# Patient Record
Sex: Male | Born: 1935 | Race: White | Hispanic: No | Marital: Married | Smoking: Former smoker
Health system: Southern US, Community
[De-identification: ages and names within clinical notes are randomized; demographics above are authoritative.]

## PROBLEM LIST (undated history)

## (undated) DIAGNOSIS — F0390 Unspecified dementia without behavioral disturbance: Secondary | ICD-10-CM

## (undated) DIAGNOSIS — I10 Essential (primary) hypertension: Secondary | ICD-10-CM

---

## 1998-03-15 ENCOUNTER — Ambulatory Visit (HOSPITAL_COMMUNITY): Admission: RE | Admit: 1998-03-15 | Discharge: 1998-03-15 | Payer: Self-pay | Admitting: Internal Medicine

## 1998-10-03 ENCOUNTER — Encounter: Payer: Self-pay | Admitting: Internal Medicine

## 1998-10-03 ENCOUNTER — Ambulatory Visit (HOSPITAL_COMMUNITY): Admission: RE | Admit: 1998-10-03 | Discharge: 1998-10-03 | Payer: Self-pay | Admitting: Internal Medicine

## 1999-12-16 ENCOUNTER — Encounter: Payer: Self-pay | Admitting: Specialist

## 1999-12-18 ENCOUNTER — Ambulatory Visit (HOSPITAL_COMMUNITY): Admission: RE | Admit: 1999-12-18 | Discharge: 1999-12-18 | Payer: Self-pay | Admitting: Specialist

## 2000-08-06 ENCOUNTER — Encounter: Payer: Self-pay | Admitting: Pediatrics

## 2000-08-06 ENCOUNTER — Encounter: Payer: Self-pay | Admitting: Internal Medicine

## 2000-08-06 ENCOUNTER — Ambulatory Visit (HOSPITAL_COMMUNITY): Admission: RE | Admit: 2000-08-06 | Discharge: 2000-08-06 | Payer: Self-pay | Admitting: Internal Medicine

## 2006-09-16 ENCOUNTER — Ambulatory Visit: Payer: Self-pay | Admitting: Thoracic Surgery

## 2010-07-09 ENCOUNTER — Ambulatory Visit: Admission: RE | Admit: 2010-07-09 | Payer: Self-pay | Source: Home / Self Care | Admitting: Orthopedic Surgery

## 2011-09-16 ENCOUNTER — Ambulatory Visit: Payer: Medicare PPO | Attending: Internal Medicine | Admitting: *Deleted

## 2011-09-16 DIAGNOSIS — R269 Unspecified abnormalities of gait and mobility: Secondary | ICD-10-CM | POA: Insufficient documentation

## 2011-09-16 DIAGNOSIS — I69998 Other sequelae following unspecified cerebrovascular disease: Secondary | ICD-10-CM | POA: Insufficient documentation

## 2011-09-16 DIAGNOSIS — IMO0001 Reserved for inherently not codable concepts without codable children: Secondary | ICD-10-CM | POA: Insufficient documentation

## 2011-09-16 DIAGNOSIS — R279 Unspecified lack of coordination: Secondary | ICD-10-CM | POA: Insufficient documentation

## 2011-09-16 DIAGNOSIS — M6281 Muscle weakness (generalized): Secondary | ICD-10-CM | POA: Insufficient documentation

## 2011-09-23 ENCOUNTER — Ambulatory Visit: Payer: Medicare PPO | Attending: Internal Medicine | Admitting: *Deleted

## 2011-09-23 DIAGNOSIS — R269 Unspecified abnormalities of gait and mobility: Secondary | ICD-10-CM | POA: Insufficient documentation

## 2011-09-23 DIAGNOSIS — IMO0001 Reserved for inherently not codable concepts without codable children: Secondary | ICD-10-CM | POA: Insufficient documentation

## 2011-09-23 DIAGNOSIS — R279 Unspecified lack of coordination: Secondary | ICD-10-CM | POA: Insufficient documentation

## 2011-09-23 DIAGNOSIS — I69998 Other sequelae following unspecified cerebrovascular disease: Secondary | ICD-10-CM | POA: Insufficient documentation

## 2011-09-23 DIAGNOSIS — M6281 Muscle weakness (generalized): Secondary | ICD-10-CM | POA: Insufficient documentation

## 2011-09-25 ENCOUNTER — Ambulatory Visit: Payer: Medicare PPO | Admitting: *Deleted

## 2011-09-29 ENCOUNTER — Ambulatory Visit: Payer: Medicare PPO | Admitting: Physical Therapy

## 2011-10-01 ENCOUNTER — Ambulatory Visit: Payer: Medicare PPO | Admitting: *Deleted

## 2011-10-06 ENCOUNTER — Ambulatory Visit: Payer: Medicare PPO | Admitting: Physical Therapy

## 2011-10-07 ENCOUNTER — Ambulatory Visit: Payer: Medicare PPO | Admitting: *Deleted

## 2011-10-09 ENCOUNTER — Ambulatory Visit: Payer: Medicare PPO | Admitting: *Deleted

## 2011-10-10 ENCOUNTER — Ambulatory Visit: Payer: Medicare PPO | Admitting: Rehabilitative and Restorative Service Providers"

## 2011-10-13 ENCOUNTER — Ambulatory Visit: Payer: Medicare PPO | Admitting: Physical Therapy

## 2011-10-15 ENCOUNTER — Ambulatory Visit: Payer: Medicare PPO | Admitting: Physical Therapy

## 2011-10-20 ENCOUNTER — Ambulatory Visit: Payer: Medicare PPO | Admitting: Rehabilitative and Restorative Service Providers"

## 2011-10-21 ENCOUNTER — Ambulatory Visit: Payer: Medicare PPO | Admitting: Rehabilitative and Restorative Service Providers"

## 2011-10-22 ENCOUNTER — Ambulatory Visit: Payer: Medicare PPO | Attending: Internal Medicine | Admitting: Physical Therapy

## 2011-10-22 DIAGNOSIS — R279 Unspecified lack of coordination: Secondary | ICD-10-CM | POA: Insufficient documentation

## 2011-10-22 DIAGNOSIS — R269 Unspecified abnormalities of gait and mobility: Secondary | ICD-10-CM | POA: Insufficient documentation

## 2011-10-22 DIAGNOSIS — IMO0001 Reserved for inherently not codable concepts without codable children: Secondary | ICD-10-CM | POA: Insufficient documentation

## 2011-10-22 DIAGNOSIS — I69998 Other sequelae following unspecified cerebrovascular disease: Secondary | ICD-10-CM | POA: Insufficient documentation

## 2011-10-22 DIAGNOSIS — M6281 Muscle weakness (generalized): Secondary | ICD-10-CM | POA: Insufficient documentation

## 2011-10-24 ENCOUNTER — Ambulatory Visit: Payer: Medicare PPO | Admitting: Rehabilitative and Restorative Service Providers"

## 2011-10-27 ENCOUNTER — Ambulatory Visit: Payer: Medicare PPO | Admitting: Physical Therapy

## 2011-10-29 ENCOUNTER — Ambulatory Visit: Payer: Medicare PPO | Admitting: Physical Therapy

## 2011-11-03 ENCOUNTER — Ambulatory Visit: Payer: Medicare PPO | Admitting: Physical Therapy

## 2011-11-05 ENCOUNTER — Ambulatory Visit: Payer: Medicare PPO | Admitting: Rehabilitative and Restorative Service Providers"

## 2011-11-10 ENCOUNTER — Ambulatory Visit: Payer: Medicare PPO | Admitting: Rehabilitative and Restorative Service Providers"

## 2011-11-12 ENCOUNTER — Ambulatory Visit: Payer: Medicare PPO | Admitting: Rehabilitative and Restorative Service Providers"

## 2011-11-18 ENCOUNTER — Ambulatory Visit: Payer: Medicare PPO | Admitting: Rehabilitative and Restorative Service Providers"

## 2011-11-19 ENCOUNTER — Ambulatory Visit: Payer: Medicare PPO | Admitting: Rehabilitative and Restorative Service Providers"

## 2012-09-15 ENCOUNTER — Encounter: Payer: Self-pay | Admitting: Internal Medicine

## 2012-09-15 ENCOUNTER — Other Ambulatory Visit (INDEPENDENT_AMBULATORY_CARE_PROVIDER_SITE_OTHER): Payer: Medicare PPO | Admitting: *Deleted

## 2012-09-15 DIAGNOSIS — I6529 Occlusion and stenosis of unspecified carotid artery: Secondary | ICD-10-CM

## 2012-09-15 DIAGNOSIS — R0989 Other specified symptoms and signs involving the circulatory and respiratory systems: Secondary | ICD-10-CM

## 2013-08-16 ENCOUNTER — Other Ambulatory Visit (HOSPITAL_COMMUNITY): Payer: Self-pay | Admitting: Internal Medicine

## 2013-08-16 ENCOUNTER — Ambulatory Visit (HOSPITAL_COMMUNITY)
Admission: RE | Admit: 2013-08-16 | Discharge: 2013-08-16 | Disposition: A | Discharge status: Home / Self Care | Payer: Medicare HMO | Source: Ambulatory Visit | Attending: Internal Medicine | Admitting: Internal Medicine

## 2013-08-16 DIAGNOSIS — R05 Cough: Secondary | ICD-10-CM | POA: Insufficient documentation

## 2013-08-16 DIAGNOSIS — J4489 Other specified chronic obstructive pulmonary disease: Secondary | ICD-10-CM | POA: Insufficient documentation

## 2013-08-16 DIAGNOSIS — Z87891 Personal history of nicotine dependence: Secondary | ICD-10-CM | POA: Insufficient documentation

## 2013-08-16 DIAGNOSIS — R062 Wheezing: Secondary | ICD-10-CM | POA: Insufficient documentation

## 2013-08-16 DIAGNOSIS — R942 Abnormal results of pulmonary function studies: Secondary | ICD-10-CM | POA: Insufficient documentation

## 2013-08-16 DIAGNOSIS — R0989 Other specified symptoms and signs involving the circulatory and respiratory systems: Secondary | ICD-10-CM | POA: Insufficient documentation

## 2013-08-16 DIAGNOSIS — R059 Cough, unspecified: Secondary | ICD-10-CM | POA: Insufficient documentation

## 2013-08-16 DIAGNOSIS — J449 Chronic obstructive pulmonary disease, unspecified: Secondary | ICD-10-CM

## 2013-08-16 DIAGNOSIS — R0609 Other forms of dyspnea: Secondary | ICD-10-CM | POA: Insufficient documentation

## 2013-08-16 LAB — PULMONARY FUNCTION TEST
DL/VA % pred: 67 %
DL/VA: 2.72 ml/min/mmHg/L
DLCO unc % pred: 58 %
DLCO unc: 13.33 ml/min/mmHg
FEF 25-75 Post: 0.88 L/sec
FEF 25-75 Pre: 1.03 L/sec
FEF2575-%Change-Post: -14 %
FEF2575-%Pred-Post: 100 %
FEF2575-%Pred-Pre: 117 %
FEV1-%Change-Post: -5 %
FEV1-%Pred-Post: 112 %
FEV1-%Pred-Pre: 118 %
FEV1-Post: 1.81 L
FEV1-Pre: 1.92 L
FEV1FVC-%Change-Post: -5 %
FEV1FVC-%Pred-Pre: 90 %
FEV6-%Change-Post: 0 %
FEV6-%Pred-Post: 134 %
FEV6-%Pred-Pre: 133 %
FEV6-Post: 2.99 L
FEV6-Pre: 2.99 L
FEV6FVC-%Change-Post: 0 %
FEV6FVC-%Pred-Post: 109 %
FEV6FVC-%Pred-Pre: 109 %
FVC-%Change-Post: 0 %
FVC-%Pred-Post: 122 %
FVC-%Pred-Pre: 121 %
FVC-Post: 3.03 L
FVC-Pre: 3.03 L
Post FEV1/FVC ratio: 60 %
Post FEV6/FVC ratio: 99 %
Pre FEV1/FVC ratio: 64 %
Pre FEV6/FVC Ratio: 99 %
RV % pred: 114 %
RV: 2.84 L
TLC % pred: 106 %
TLC: 6.05 L

## 2013-08-16 MED ORDER — ALBUTEROL SULFATE (2.5 MG/3ML) 0.083% IN NEBU
2.5000 mg | INHALATION_SOLUTION | Freq: Once | RESPIRATORY_TRACT | Status: AC
  Administered 2013-08-16: 2.5 mg via RESPIRATORY_TRACT

## 2014-08-16 DIAGNOSIS — F0390 Unspecified dementia without behavioral disturbance: Secondary | ICD-10-CM | POA: Diagnosis not present

## 2014-08-16 DIAGNOSIS — Z6823 Body mass index (BMI) 23.0-23.9, adult: Secondary | ICD-10-CM | POA: Diagnosis not present

## 2014-08-16 DIAGNOSIS — I10 Essential (primary) hypertension: Secondary | ICD-10-CM | POA: Diagnosis not present

## 2014-08-16 DIAGNOSIS — R42 Dizziness and giddiness: Secondary | ICD-10-CM | POA: Diagnosis not present

## 2014-08-22 DIAGNOSIS — L814 Other melanin hyperpigmentation: Secondary | ICD-10-CM | POA: Diagnosis not present

## 2014-08-22 DIAGNOSIS — L821 Other seborrheic keratosis: Secondary | ICD-10-CM | POA: Diagnosis not present

## 2014-08-22 DIAGNOSIS — L570 Actinic keratosis: Secondary | ICD-10-CM | POA: Diagnosis not present

## 2014-08-22 DIAGNOSIS — D225 Melanocytic nevi of trunk: Secondary | ICD-10-CM | POA: Diagnosis not present

## 2014-08-30 DIAGNOSIS — J449 Chronic obstructive pulmonary disease, unspecified: Secondary | ICD-10-CM | POA: Diagnosis not present

## 2014-08-30 DIAGNOSIS — F0390 Unspecified dementia without behavioral disturbance: Secondary | ICD-10-CM | POA: Diagnosis not present

## 2014-08-30 DIAGNOSIS — I10 Essential (primary) hypertension: Secondary | ICD-10-CM | POA: Diagnosis not present

## 2014-08-30 DIAGNOSIS — Z6823 Body mass index (BMI) 23.0-23.9, adult: Secondary | ICD-10-CM | POA: Diagnosis not present

## 2014-08-30 DIAGNOSIS — M1990 Unspecified osteoarthritis, unspecified site: Secondary | ICD-10-CM | POA: Diagnosis not present

## 2014-08-30 DIAGNOSIS — D510 Vitamin B12 deficiency anemia due to intrinsic factor deficiency: Secondary | ICD-10-CM | POA: Diagnosis not present

## 2014-08-30 DIAGNOSIS — G3184 Mild cognitive impairment, so stated: Secondary | ICD-10-CM | POA: Diagnosis not present

## 2014-10-04 DIAGNOSIS — I10 Essential (primary) hypertension: Secondary | ICD-10-CM | POA: Diagnosis not present

## 2014-10-04 DIAGNOSIS — Z6823 Body mass index (BMI) 23.0-23.9, adult: Secondary | ICD-10-CM | POA: Diagnosis not present

## 2014-10-04 DIAGNOSIS — J449 Chronic obstructive pulmonary disease, unspecified: Secondary | ICD-10-CM | POA: Diagnosis not present

## 2014-10-04 DIAGNOSIS — F0390 Unspecified dementia without behavioral disturbance: Secondary | ICD-10-CM | POA: Diagnosis not present

## 2014-10-04 DIAGNOSIS — L03113 Cellulitis of right upper limb: Secondary | ICD-10-CM | POA: Diagnosis not present

## 2014-10-04 DIAGNOSIS — M1990 Unspecified osteoarthritis, unspecified site: Secondary | ICD-10-CM | POA: Diagnosis not present

## 2014-10-25 DIAGNOSIS — I10 Essential (primary) hypertension: Secondary | ICD-10-CM | POA: Diagnosis not present

## 2014-10-25 DIAGNOSIS — Z6823 Body mass index (BMI) 23.0-23.9, adult: Secondary | ICD-10-CM | POA: Diagnosis not present

## 2014-10-25 DIAGNOSIS — J449 Chronic obstructive pulmonary disease, unspecified: Secondary | ICD-10-CM | POA: Diagnosis not present

## 2014-10-25 DIAGNOSIS — F0390 Unspecified dementia without behavioral disturbance: Secondary | ICD-10-CM | POA: Diagnosis not present

## 2014-10-25 DIAGNOSIS — M1990 Unspecified osteoarthritis, unspecified site: Secondary | ICD-10-CM | POA: Diagnosis not present

## 2014-10-25 DIAGNOSIS — I872 Venous insufficiency (chronic) (peripheral): Secondary | ICD-10-CM | POA: Diagnosis not present

## 2014-10-25 DIAGNOSIS — D692 Other nonthrombocytopenic purpura: Secondary | ICD-10-CM | POA: Diagnosis not present

## 2014-10-26 DIAGNOSIS — H524 Presbyopia: Secondary | ICD-10-CM | POA: Diagnosis not present

## 2014-10-26 DIAGNOSIS — H5203 Hypermetropia, bilateral: Secondary | ICD-10-CM | POA: Diagnosis not present

## 2014-10-26 DIAGNOSIS — Z0100 Encounter for examination of eyes and vision without abnormal findings: Secondary | ICD-10-CM | POA: Diagnosis not present

## 2014-10-26 DIAGNOSIS — H52209 Unspecified astigmatism, unspecified eye: Secondary | ICD-10-CM | POA: Diagnosis not present

## 2014-11-01 DIAGNOSIS — L820 Inflamed seborrheic keratosis: Secondary | ICD-10-CM | POA: Diagnosis not present

## 2015-01-05 DIAGNOSIS — F0390 Unspecified dementia without behavioral disturbance: Secondary | ICD-10-CM | POA: Diagnosis not present

## 2015-01-05 DIAGNOSIS — Z6822 Body mass index (BMI) 22.0-22.9, adult: Secondary | ICD-10-CM | POA: Diagnosis not present

## 2015-01-05 DIAGNOSIS — I10 Essential (primary) hypertension: Secondary | ICD-10-CM | POA: Diagnosis not present

## 2015-02-22 DIAGNOSIS — F0390 Unspecified dementia without behavioral disturbance: Secondary | ICD-10-CM | POA: Diagnosis not present

## 2015-02-22 DIAGNOSIS — Z6823 Body mass index (BMI) 23.0-23.9, adult: Secondary | ICD-10-CM | POA: Diagnosis not present

## 2015-02-22 DIAGNOSIS — I10 Essential (primary) hypertension: Secondary | ICD-10-CM | POA: Diagnosis not present

## 2015-02-22 DIAGNOSIS — D692 Other nonthrombocytopenic purpura: Secondary | ICD-10-CM | POA: Diagnosis not present

## 2015-02-22 DIAGNOSIS — M1990 Unspecified osteoarthritis, unspecified site: Secondary | ICD-10-CM | POA: Diagnosis not present

## 2015-02-22 DIAGNOSIS — J449 Chronic obstructive pulmonary disease, unspecified: Secondary | ICD-10-CM | POA: Diagnosis not present

## 2015-03-19 DIAGNOSIS — Z6823 Body mass index (BMI) 23.0-23.9, adult: Secondary | ICD-10-CM | POA: Diagnosis not present

## 2015-03-19 DIAGNOSIS — C760 Malignant neoplasm of head, face and neck: Secondary | ICD-10-CM | POA: Diagnosis not present

## 2015-03-20 DIAGNOSIS — C44329 Squamous cell carcinoma of skin of other parts of face: Secondary | ICD-10-CM | POA: Diagnosis not present

## 2015-03-20 DIAGNOSIS — C4442 Squamous cell carcinoma of skin of scalp and neck: Secondary | ICD-10-CM | POA: Diagnosis not present

## 2015-03-26 DIAGNOSIS — M1990 Unspecified osteoarthritis, unspecified site: Secondary | ICD-10-CM | POA: Diagnosis not present

## 2015-03-26 DIAGNOSIS — F0390 Unspecified dementia without behavioral disturbance: Secondary | ICD-10-CM | POA: Diagnosis not present

## 2015-03-26 DIAGNOSIS — D692 Other nonthrombocytopenic purpura: Secondary | ICD-10-CM | POA: Diagnosis not present

## 2015-03-26 DIAGNOSIS — I10 Essential (primary) hypertension: Secondary | ICD-10-CM | POA: Diagnosis not present

## 2015-03-26 DIAGNOSIS — J449 Chronic obstructive pulmonary disease, unspecified: Secondary | ICD-10-CM | POA: Diagnosis not present

## 2015-03-26 DIAGNOSIS — Z6823 Body mass index (BMI) 23.0-23.9, adult: Secondary | ICD-10-CM | POA: Diagnosis not present

## 2015-05-08 DIAGNOSIS — Z85828 Personal history of other malignant neoplasm of skin: Secondary | ICD-10-CM | POA: Diagnosis not present

## 2015-05-08 DIAGNOSIS — L570 Actinic keratosis: Secondary | ICD-10-CM | POA: Diagnosis not present

## 2015-06-06 DIAGNOSIS — L570 Actinic keratosis: Secondary | ICD-10-CM | POA: Diagnosis not present

## 2015-07-23 DIAGNOSIS — J449 Chronic obstructive pulmonary disease, unspecified: Secondary | ICD-10-CM | POA: Diagnosis not present

## 2015-07-23 DIAGNOSIS — D692 Other nonthrombocytopenic purpura: Secondary | ICD-10-CM | POA: Diagnosis not present

## 2015-07-23 DIAGNOSIS — C3490 Malignant neoplasm of unspecified part of unspecified bronchus or lung: Secondary | ICD-10-CM | POA: Diagnosis not present

## 2015-07-23 DIAGNOSIS — R269 Unspecified abnormalities of gait and mobility: Secondary | ICD-10-CM | POA: Diagnosis not present

## 2015-07-23 DIAGNOSIS — I872 Venous insufficiency (chronic) (peripheral): Secondary | ICD-10-CM | POA: Diagnosis not present

## 2015-07-23 DIAGNOSIS — F0390 Unspecified dementia without behavioral disturbance: Secondary | ICD-10-CM | POA: Diagnosis not present

## 2015-07-23 DIAGNOSIS — M549 Dorsalgia, unspecified: Secondary | ICD-10-CM | POA: Diagnosis not present

## 2015-07-23 DIAGNOSIS — I619 Nontraumatic intracerebral hemorrhage, unspecified: Secondary | ICD-10-CM | POA: Diagnosis not present

## 2015-07-23 DIAGNOSIS — D510 Vitamin B12 deficiency anemia due to intrinsic factor deficiency: Secondary | ICD-10-CM | POA: Diagnosis not present

## 2015-07-27 DIAGNOSIS — S81802A Unspecified open wound, left lower leg, initial encounter: Secondary | ICD-10-CM | POA: Diagnosis not present

## 2015-07-27 DIAGNOSIS — M25552 Pain in left hip: Secondary | ICD-10-CM | POA: Diagnosis not present

## 2015-07-27 DIAGNOSIS — I10 Essential (primary) hypertension: Secondary | ICD-10-CM | POA: Diagnosis not present

## 2015-07-30 DIAGNOSIS — Z6824 Body mass index (BMI) 24.0-24.9, adult: Secondary | ICD-10-CM | POA: Diagnosis not present

## 2015-07-30 DIAGNOSIS — S81802A Unspecified open wound, left lower leg, initial encounter: Secondary | ICD-10-CM | POA: Diagnosis not present

## 2015-07-30 DIAGNOSIS — Z5189 Encounter for other specified aftercare: Secondary | ICD-10-CM | POA: Diagnosis not present

## 2015-08-01 ENCOUNTER — Ambulatory Visit: Payer: Commercial Managed Care - HMO | Attending: Internal Medicine | Admitting: Physical Therapy

## 2015-08-01 DIAGNOSIS — R269 Unspecified abnormalities of gait and mobility: Secondary | ICD-10-CM | POA: Insufficient documentation

## 2015-08-01 DIAGNOSIS — M25552 Pain in left hip: Secondary | ICD-10-CM | POA: Diagnosis not present

## 2015-08-01 DIAGNOSIS — R531 Weakness: Secondary | ICD-10-CM | POA: Diagnosis not present

## 2015-08-01 NOTE — Therapy (Signed)
[facility] [address] [place], [place], [address] Phone: [phone]   Fax:  [phone]  Physical Therapy Evaluation  Patient Details  Name: Ronald Singh MRN: [id number] Date of Birth: 1922/04/26 Referring Provider: Burnard Bunting, MD  Encounter Date: 08/01/2015      PT End of Session - 08/01/15 1024    Visit Number 1   Number of Visits 12   Date for PT Re-Evaluation 09/12/15   Authorization Type [employer] (auth required)   PT Start Time 0920   PT Stop Time 1004   PT Time Calculation (min) 44 min   Equipment Utilized During Treatment Gait belt   Activity Tolerance Patient tolerated treatment well   Behavior During Therapy [facility] for tasks assessed/performed      No past medical history on file.  No past surgical history on file.  There were no vitals filed for this visit.  Visit Diagnosis:  Left hip pain - Plan: PT plan of care cert/re-cert  Abnormality of gait - Plan: PT plan of care cert/re-cert  Weakness - Plan: PT plan of care cert/re-cert      Subjective Assessment - 08/01/15 0921    Subjective Pt is a 82 y/o male who presents to OPPT with LBP and gait abnormalities.  Pt reports LBP x 20 years.  Pt reports pain in L buttocks, and seen at this facility ~ 4years ago for same condition.  Pt states he did not continue with exercises and therefore pain has returned.     Pertinent History HTN, short term memory loss   Limitations Walking;House hold activities   How long can you walk comfortably? no difficutly with cane   Patient Stated Goals improve pain, mobility   Currently in Pain? Yes   Pain Score 5    Pain Location Buttocks   Pain Orientation Left   Pain Descriptors / Indicators Burning;Sharp  stinging   Pain Type Chronic pain   Pain Onset More than a month ago   Pain Frequency Constant   Aggravating Factors  walking   Pain Relieving Factors sitting, rest            [facility] PT  Assessment - 08/01/15 0927    Assessment   Medical Diagnosis LBP   Referring Provider Burnard Bunting, MD   Onset Date/Surgical Date --  42 years   Next MD Visit unknown   Prior Therapy 4 years ago for same condition   Precautions   Precautions Fall   Restrictions   Weight Bearing Restrictions No   Balance Screen   Has the patient fallen in the past 6 months No   Has the patient had a decrease in activity level because of a fear of falling?  No   Is the patient reluctant to leave their home because of a fear of falling?  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 [employer]   Leisure talk to people, go to  beach   Cognition   Overall Cognitive Status History of cognitive impairments - at baseline   Observation/Other Assessments   Focus on Therapeutic Outcomes (FOTO)  64 (36% limited; predicted 28% limited)   Other Surveys  Other Surveys   Activities of Balance Confidence Scale (ABC Scale)  43.8%   Posture/Postural Control   Posture/Postural Control Postural limitations   Postural Limitations Right pelvic obliquity;Decreased lumbar lordosis;Rounded Shoulders;Forward head   AROM   AROM Assessment Site Lumbar   Lumbar Flexion 58   Lumbar Extension 7   Lumbar - Right Side Bend 20   Lumbar - Left Side Bend 25   Strength   Strength Assessment Site Hip;Knee;Ankle   Right Hip Flexion 4/5   Right Hip ABduction 3+/5   Left Hip Flexion 4/5   Left Hip  ABduction 3/5   Right/Left Knee Right;Left   Right Knee Extension 5/5   Left Knee Extension 5/5   Right Ankle Dorsiflexion 4/5   Left Ankle Dorsiflexion 4/5   Flexibility   Hamstrings tightness bil   Piriformis tightness bil   Palpation   Palpation comment tenderness at L piriformis and posterior to greater trochanter   Special Tests    Special Tests Lumbar   Lumbar Tests Straight Leg Raise   Straight Leg Raise   Findings Negative   Transfers   Transfers Sit to Stand   Sit to Stand 5: Supervision   Sit to Stand Details (indicate cue type and reason) posterior lean with transfers   Ambulation/Gait   Ambulation/Gait Yes   Ambulation/Gait Assistance 5: Supervision   Ambulation Distance (Feet) 75 Feet   Assistive device None  pt held [facility] but did not use   Gait Pattern Shuffle;Decreased stance time - left;Decreased step length - right;Step-to pattern   Ambulation Surface Level;Indoor   Gait velocity 2.79 ft/sec  11.77 sec   Standardized Balance Assessment   Standardized Balance Assessment Timed Up and Go Test   Timed Up and Go Test   Normal TUG (seconds) 14.09                           PT Education - 08/01/15 1023    Education provided Yes   Education Details HEP   Person(s) Educated Patient   Methods Explanation;Demonstration;Handout;Tactile cues;Verbal cues   Comprehension Verbalized understanding;Need further instruction;Returned demonstration;Verbal cues required;Tactile cues required          PT Short Term Goals - 08/01/15 1029    PT SHORT TERM GOAL #1   Title report pain </= 2/10 with walking > 30 min for improved pain and increased mobiity (08/15/15)   Time 2   Period Weeks   Status New   PT SHORT TERM GOAL #2   Title pt/caregiver to understand posture/body mechanics to decrease risk of reinjury (08/15/15)   Time 2   Period Weeks   Status New           PT Long Term Goals - 08/01/15 1030    PT LONG TERM GOAL #1   Title pt/caregiver  to report compliance with HEP (09/12/15)   Time 6   Period Weeks   Status New   PT LONG TERM GOAL #2   Title improve timed up and go to < 13 sec to demonstrate decreased fall risk and improved function (09/12/15)   Time 6   Period Weeks   Status New   PT LONG TERM GOAL #3   Title negotiate 4 stairs  with railing and reciprocal pattern with supervision for improved strength and function (09/12/15)   Time 6   Period Weeks   Status New               Plan - 08-23-15 1024    Clinical Impression Statement Pt is a 82 y/o male who presents to OPPT for moderate complexity evaluation of LBP/L hip pain and gait abnormalities.  Pt demonstrates gait deviations and impaired balance, decreased strength and decreased ROM affecting safe functional mobility.  Pt will benefit from PT to address deficits listed below.     Pt will benefit from skilled therapeutic intervention in order to improve on the following deficits Abnormal gait;Decreased balance;Decreased mobility;Decreased strength;Postural dysfunction;Impaired flexibility;Pain;Decreased range of motion;Decreased safety awareness   Rehab Potential Good   Clinical Impairments Affecting Rehab Potential decreased memory, HTN   PT Frequency 2x / week   PT Duration 6 weeks   PT Treatment/Interventions ADLs/Self Care Home Management;Cryotherapy;Electrical Stimulation;Moist Heat;Therapeutic exercise;Therapeutic activities;Functional mobility training;Stair training;Gait training;Ultrasound;Balance training;Neuromuscular re-education;Patient/family education;Manual techniques   PT Next Visit Plan review HEP, core/hip strengthening and balance activities   Consulted and Agree with Plan of Care Patient          G-Codes - 08-23-15 1034    Functional Assessment Tool Used timed up and go 14.09 sec   Functional Limitation Mobility: Walking and moving around   Mobility: Walking and Moving Around Current Status (N1916) At least 20 percent but less than 40  percent impaired, limited or restricted   Mobility: Walking and Moving Around Goal Status (O0600) At least 1 percent but less than 20 percent impaired, limited or restricted       Problem List There are no active problems to display for this patient.  Laureen Abrahams, PT, DPT 2015/08/23 10:37 AM  [facility] [address] [place], [place], [address] Phone: [phone]   Fax:  [phone]  Name: NEWTON FRUTIGER MRN: [id number] Date of Birth: 10/28/1921

## 2015-08-01 NOTE — Patient Instructions (Signed)
Knee: Extension    Be sure bottom is flat and back supported. Place towel roll under left ankle. Push gently down on thigh to straighten knee. Do not allow leg to twist. Hold __20-30__ seconds. Repeat _2-3___ times. Do __2-3__ sessions per day. CAUTION: Do not force joint. Stretch should be gentle and slow. Activity: Prop leg on low table and lean on knee.*  Copyright  VHI. All rights reserved.   Hip Extension    Lie on back, legs in air, knees bent. Grasp hands behind one thigh and cross other leg over same thigh. Hold _20-30___ seconds. Repeat with other leg held. Repeat _2-3___ times. Do _2-3___ sessions per day.  Copyright  VHI. All rights reserved.

## 2015-08-08 ENCOUNTER — Ambulatory Visit: Payer: Medicare HMO | Admitting: Physical Therapy

## 2015-08-08 ENCOUNTER — Ambulatory Visit: Payer: Commercial Managed Care - HMO | Admitting: Physical Therapy

## 2015-08-08 DIAGNOSIS — R531 Weakness: Secondary | ICD-10-CM | POA: Diagnosis not present

## 2015-08-08 DIAGNOSIS — R269 Unspecified abnormalities of gait and mobility: Secondary | ICD-10-CM | POA: Diagnosis not present

## 2015-08-08 DIAGNOSIS — L570 Actinic keratosis: Secondary | ICD-10-CM | POA: Diagnosis not present

## 2015-08-08 DIAGNOSIS — M25552 Pain in left hip: Secondary | ICD-10-CM

## 2015-08-08 DIAGNOSIS — L821 Other seborrheic keratosis: Secondary | ICD-10-CM | POA: Diagnosis not present

## 2015-08-08 NOTE — Patient Instructions (Signed)
Straight Leg Raise    Tighten stomach and slowly raise locked right leg __6-8__ inches from floor. Repeat __10__ times per set. Do _1___ sets per session. Do __1-2__ sessions per day.  http://orth.exer.us/1102   Copyright  VHI. All rights reserved.   Bridging    Slowly raise buttocks from floor, keeping stomach tight. Repeat _10___ times per set. Do _1___ sets per session. Do __1-2__ sessions per day.  http://orth.exer.us/1096   Copyright  VHI. All rights reserved.   Clam Shell 45 Degrees    Lying with hips and knees bent 45, one pillow between knees and ankles. Lift knee. Be sure pelvis does not roll backward. Do not arch back. Do __10_ times, each leg, __1-2_ times per day.  http://ss.exer.us/74   Copyright  VHI. All rights reserved.

## 2015-08-08 NOTE — Therapy (Signed)
[facility] [address] [place], [place], [address] Phone: [phone]   Fax:  [phone]  Physical Therapy Treatment  Patient Details  Name: Ronald Singh MRN: [id number] Date of Birth: Aug 14, 1921 Referring Provider: Burnard Bunting, MD  Encounter Date: 08/08/2015      PT End of Session - 08/08/15 1622    Visit Number 2   Number of Visits 12   Date for PT Re-Evaluation 09/12/15   Authorization Type [employer] (auth required)   PT Start Time 1545   PT Stop Time 1628   PT Time Calculation (min) 43 min   Activity Tolerance Patient tolerated treatment well   Behavior During Therapy [facility] for tasks assessed/performed      No past medical history on file.  No past surgical history on file.  There were no vitals filed for this visit.  Visit Diagnosis:  Left hip pain  Weakness  Abnormality of gait      Subjective Assessment - 08/08/15 1547    Subjective Still having some pain in L buttock.   Patient Stated Goals improve pain, mobility   Currently in Pain? Yes   Pain Score 2    Pain Location Buttocks   Pain Orientation Left   Pain Descriptors / Indicators Aching   Pain Type Chronic pain   Pain Onset More than a month ago   Pain Frequency Constant   Aggravating Factors  walking   Pain Relieving Factors sitting, rest                         [facility] Adult PT Treatment/Exercise - 08/08/15 1555    Exercises   Exercises Knee/Hip;Lumbar   Lumbar Exercises: Stretches   Passive Hamstring Stretch 2 reps;20 seconds   Passive Hamstring Stretch Limitations passive with PT assist   Piriformis Stretch 2 reps;20 seconds   Piriformis Stretch Limitations Left   Knee/Hip Exercises: Stretches   Press photographer Left;2 reps;20 seconds   Knee/Hip Exercises: Aerobic   Nustep L 4 x 5 min   Knee/Hip Exercises: Standing   Heel Raises Both;20 reps   Hip Abduction Both;10 reps   Hip Extension Both;10 reps    Knee/Hip Exercises: Supine   Bridges 10 reps   Straight Leg Raises Both;10 reps   Knee/Hip Exercises: Sidelying   Clams x 10 bil                PT Education - 08/08/15 1622    Education provided Yes   Education Details HEP   Person(s) Educated Patient   Methods Explanation;Demonstration;Handout   Comprehension Verbalized understanding;Need further instruction;Returned demonstration;Verbal cues required;Tactile cues required          PT Short Term Goals - 08/01/15 1029    PT SHORT TERM GOAL #1   Title report pain </= 2/10 with walking > 30 min for improved pain and increased mobiity (08/15/15)   Time 2   Period Weeks   Status New   PT SHORT TERM GOAL #2   Title pt/caregiver to understand posture/body mechanics to decrease risk of reinjury (08/15/15)   Time 2   Period Weeks   Status New           PT Long Term Goals - 08/01/15 1030    PT LONG TERM GOAL #1   Title pt/caregiver to report compliance with HEP (09/12/15)   Time 6   Period Weeks   Status New   PT LONG TERM GOAL #2  Title improve timed up and go to < 13 sec to demonstrate decreased fall risk and improved function (09/12/15)   Time 6   Period Weeks   Status New   PT LONG TERM GOAL #3   Title negotiate 4 stairs with railing and reciprocal pattern with supervision for improved strength and function (09/12/15)   Time 6   Period Weeks   Status New               Plan - 08/08/15 1623    Clinical Impression Statement Pt needs multiple cues and redirection to tasks due to cognition.  Will benefit from PT if pt able to participate fully in PT and home exercises.  Unsure if pt will make progress due to cognition.   PT Next Visit Plan review HEP, core/hip strengthening and balance activities   Consulted and Agree with Plan of Care Patient        Problem List There are no active problems to display for this patient.  Laureen Abrahams, PT, DPT 08/08/2015 4:29 PM  [facility] [address] [place], [place], [address] Phone: [phone]   Fax:  [phone]  Name: Ronald Singh MRN: [id number] Date of Birth: March 28, 1922

## 2015-08-09 DIAGNOSIS — L0889 Other specified local infections of the skin and subcutaneous tissue: Secondary | ICD-10-CM | POA: Diagnosis not present

## 2015-08-09 DIAGNOSIS — S81802D Unspecified open wound, left lower leg, subsequent encounter: Secondary | ICD-10-CM | POA: Diagnosis not present

## 2015-08-09 DIAGNOSIS — Z6823 Body mass index (BMI) 23.0-23.9, adult: Secondary | ICD-10-CM | POA: Diagnosis not present

## 2015-08-10 ENCOUNTER — Ambulatory Visit: Payer: Commercial Managed Care - HMO | Admitting: Physical Therapy

## 2015-08-10 DIAGNOSIS — R531 Weakness: Secondary | ICD-10-CM

## 2015-08-10 DIAGNOSIS — M25552 Pain in left hip: Secondary | ICD-10-CM

## 2015-08-10 DIAGNOSIS — R269 Unspecified abnormalities of gait and mobility: Secondary | ICD-10-CM | POA: Diagnosis not present

## 2015-08-10 NOTE — Therapy (Signed)
[facility] [address] [place], [place], [address] Phone: [phone]   Fax:  [phone]  Physical Therapy Treatment  Patient Details  Name: Ronald Singh MRN: [id number] Date of Birth: 02/27/1922 Referring Provider: Burnard Bunting, MD  Encounter Date: 08/10/2015      PT End of Session - 08/10/15 1007    Visit Number 3   Number of Visits 12   Date for PT Re-Evaluation 09/12/15   Authorization Type [employer] (no auth required)   PT Start Time 0932   PT Stop Time 1013   PT Time Calculation (min) 41 min   Equipment Utilized During Treatment Gait belt   Activity Tolerance Patient tolerated treatment well   Behavior During Therapy [facility] for tasks assessed/performed      No past medical history on file.  No past surgical history on file.  There were no vitals filed for this visit.  Visit Diagnosis:  Left hip pain  Weakness  Abnormality of gait      Subjective Assessment - 08/10/15 0938    Subjective "This leg feels funny." (pointing to LLE; unable to explain)   Pertinent History HTN, short term memory loss   Limitations Walking;House hold activities   How long can you walk comfortably? no difficutly with cane   Patient Stated Goals improve pain, mobility   Currently in Pain? No/denies                         [facility] Adult PT Treatment/Exercise - 08/10/15 0939    Exercises   Exercises Ankle   Lumbar Exercises: Stretches   Passive Hamstring Stretch 2 reps;20 seconds   Passive Hamstring Stretch Limitations seated; bil   Piriformis Stretch 2 reps;20 seconds   Piriformis Stretch Limitations bil; sitting   Knee/Hip Exercises: Aerobic   Nustep L 5 x 5 min   Knee/Hip Exercises: Standing   Other Standing Knee Exercises forward and lateral step-over weight shift over 1/2 foam roll x 20 bil; with 1 and 2 UE support   Knee/Hip Exercises: Seated   Marching Limitations x10 bil with 3 sec hold    Ankle Exercises: Seated   Other Seated Ankle Exercises seated dorsiflexion/plantarflexion with red tband x 10 bil                  PT Short Term Goals - 08/01/15 1029    PT SHORT TERM GOAL #1   Title report pain </= 2/10 with walking > 30 min for improved pain and increased mobiity (08/15/15)   Time 2   Period Weeks   Status New   PT SHORT TERM GOAL #2   Title pt/caregiver to understand posture/body mechanics to decrease risk of reinjury (08/15/15)   Time 2   Period Weeks   Status New           PT Long Term Goals - 08/01/15 1030    PT LONG TERM GOAL #1   Title pt/caregiver to report compliance with HEP (09/12/15)   Time 6   Period Weeks   Status New   PT LONG TERM GOAL #2   Title improve timed up and go to < 13 sec to demonstrate decreased fall risk and improved function (09/12/15)   Time 6   Period Weeks   Status New   PT LONG TERM GOAL #3   Title negotiate 4 stairs with railing and reciprocal pattern with supervision for improved strength and function (09/12/15)  Time 6   Period Weeks   Status New               Plan - 08/10/15 1007    Clinical Impression Statement Additional copy of exercises provided as pt states he doesn't know where his are.  Cognition limiting progress.  Pt not complaining of pain in L hip.   PT Next Visit Plan review HEP, core/hip strengthening and balance activities   Consulted and Agree with Plan of Care Patient        Problem List There are no active problems to display for this patient.  Laureen Abrahams, PT, DPT 08/10/2015 10:14 AM  [facility] [address] [place], [place], [address] Phone: [phone]   Fax:  [phone]  Name: Ronald Singh MRN: [id number] Date of Birth: 03/22/1922

## 2015-08-13 DIAGNOSIS — R0989 Other specified symptoms and signs involving the circulatory and respiratory systems: Secondary | ICD-10-CM | POA: Diagnosis not present

## 2015-08-13 DIAGNOSIS — J209 Acute bronchitis, unspecified: Secondary | ICD-10-CM | POA: Diagnosis not present

## 2015-08-13 DIAGNOSIS — R05 Cough: Secondary | ICD-10-CM | POA: Diagnosis not present

## 2015-08-13 DIAGNOSIS — Z6824 Body mass index (BMI) 24.0-24.9, adult: Secondary | ICD-10-CM | POA: Diagnosis not present

## 2015-08-15 ENCOUNTER — Ambulatory Visit: Payer: Commercial Managed Care - HMO | Admitting: Physical Therapy

## 2015-08-17 ENCOUNTER — Ambulatory Visit: Payer: Commercial Managed Care - HMO | Admitting: Physical Therapy

## 2015-08-17 DIAGNOSIS — L0889 Other specified local infections of the skin and subcutaneous tissue: Secondary | ICD-10-CM | POA: Diagnosis not present

## 2015-08-17 DIAGNOSIS — Z6823 Body mass index (BMI) 23.0-23.9, adult: Secondary | ICD-10-CM | POA: Diagnosis not present

## 2015-08-17 DIAGNOSIS — J208 Acute bronchitis due to other specified organisms: Secondary | ICD-10-CM | POA: Diagnosis not present

## 2015-08-20 DIAGNOSIS — I10 Essential (primary) hypertension: Secondary | ICD-10-CM | POA: Diagnosis not present

## 2015-08-20 DIAGNOSIS — J209 Acute bronchitis, unspecified: Secondary | ICD-10-CM | POA: Diagnosis not present

## 2015-08-20 DIAGNOSIS — Z6823 Body mass index (BMI) 23.0-23.9, adult: Secondary | ICD-10-CM | POA: Diagnosis not present

## 2015-08-20 DIAGNOSIS — J449 Chronic obstructive pulmonary disease, unspecified: Secondary | ICD-10-CM | POA: Diagnosis not present

## 2015-08-22 ENCOUNTER — Ambulatory Visit: Payer: Commercial Managed Care - HMO | Attending: Internal Medicine | Admitting: Physical Therapy

## 2015-08-22 VITALS — HR 73

## 2015-08-22 DIAGNOSIS — R269 Unspecified abnormalities of gait and mobility: Secondary | ICD-10-CM | POA: Insufficient documentation

## 2015-08-22 DIAGNOSIS — R531 Weakness: Secondary | ICD-10-CM | POA: Diagnosis not present

## 2015-08-22 DIAGNOSIS — M25552 Pain in left hip: Secondary | ICD-10-CM | POA: Diagnosis not present

## 2015-08-22 NOTE — Therapy (Signed)
[facility] [address] [place], [place], [address] Phone: [phone]   Fax:  [phone]  Physical Therapy Treatment  Patient Details  Name: Ronald Singh MRN: [id number] Date of Birth: 08-Dec-1921 Referring Provider: Burnard Bunting, MD  Encounter Date: 08/22/2015      PT End of Session - 08/22/15 1006    Visit Number 4   Number of Visits 12   Date for PT Re-Evaluation 09/12/15   PT Start Time 0940  pt arrived late   PT Stop Time 1013   PT Time Calculation (min) 33 min   Equipment Utilized During Treatment Gait belt   Activity Tolerance Patient tolerated treatment well   Behavior During Therapy [facility] for tasks assessed/performed      No past medical history on file.  No past surgical history on file.  Filed Vitals:   08/22/15 0942  Pulse: 73  SpO2: 95%    Visit Diagnosis:  Left hip pain  Weakness  Abnormality of gait      Subjective Assessment - 08/22/15 0942    Subjective Pt arrives todayafter missing 1 wk of PT; caregiver states pt was sick.  "I think I have a cold. My head is stopped up"   Pertinent History HTN, short term memory loss   Patient Stated Goals improve pain, mobility   Currently in Pain? No/denies                         [facility] Adult PT Treatment/Exercise - 08/22/15 0943    Lumbar Exercises: Stretches   Passive Hamstring Stretch 2 reps;20 seconds   Passive Hamstring Stretch Limitations seated; bil   Lumbar Exercises: Standing   Heel Raises 20 reps   Heel Raises Limitations with bil UE support   Knee/Hip Exercises: Aerobic   Nustep L 5 x 5 min   Knee/Hip Exercises: Standing   Hip Flexion Both;2 sets;10 reps;Knee bent   Hip Abduction Both;Knee straight;2 sets;10 reps   Abduction Limitations with bil UE support   Hip Extension Both;2 sets;10 reps;Knee straight   Knee/Hip Exercises: Seated   Long Arc Quad Both;2 sets;10 reps   Long Arc Quad Weight 2 lbs.    Marching Limitations 2x10 bil with 2#   Knee/Hip Exercises: Supine   Bridges 2 sets;10 reps   Other Supine Knee/Hip Exercises clamshells with red tband 2x10                  PT Short Term Goals - 08/01/15 1029    PT SHORT TERM GOAL #1   Title report pain </= 2/10 with walking > 30 min for improved pain and increased mobiity (08/15/15)   Time 2   Period Weeks   Status New   PT SHORT TERM GOAL #2   Title pt/caregiver to understand posture/body mechanics to decrease risk of reinjury (08/15/15)   Time 2   Period Weeks   Status New           PT Long Term Goals - 08/01/15 1030    PT LONG TERM GOAL #1   Title pt/caregiver to report compliance with HEP (09/12/15)   Time 6   Period Weeks   Status New   PT LONG TERM GOAL #2   Title improve timed up and go to < 13 sec to demonstrate decreased fall risk and improved function (09/12/15)   Time 6   Period Weeks   Status New   PT LONG TERM GOAL #  3   Title negotiate 4 stairs with railing and reciprocal pattern with supervision for improved strength and function (09/12/15)   Time 6   Period Weeks   Status New               Plan - 08/22/15 1006    Clinical Impression Statement Pt initially without c/o pain but with exercises c/o L hip discomfort ("this leg is giving me a fit" unable to further describe).  Pain is inconsistent and difficult to address due to cognition.  Unsure if pt will meet goals due to cognition.     PT Next Visit Plan review HEP, core/hip strengthening and balance activities   Consulted and Agree with Plan of Care Patient        Problem List There are no active problems to display for this patient.  Laureen Abrahams, PT, DPT 08/22/2015 10:13 AM  [facility] [address] [place], [place], [address] Phone: [phone]   Fax:  [phone]  Name: Ronald Singh MRN: [id number] Date of Birth: 1922/02/21

## 2015-08-24 ENCOUNTER — Ambulatory Visit: Payer: Commercial Managed Care - HMO | Admitting: Rehabilitative and Restorative Service Providers"

## 2015-08-24 VITALS — BP 147/84

## 2015-08-24 DIAGNOSIS — M25552 Pain in left hip: Secondary | ICD-10-CM | POA: Diagnosis not present

## 2015-08-24 DIAGNOSIS — R269 Unspecified abnormalities of gait and mobility: Secondary | ICD-10-CM

## 2015-08-24 DIAGNOSIS — R531 Weakness: Secondary | ICD-10-CM

## 2015-08-24 NOTE — Therapy (Signed)
[facility] [address] [place], [place], [address] Phone: [phone]   Fax:  [phone]  Physical Therapy Treatment  Patient Details  Name: Ronald Singh MRN: [id number] Date of Birth: 04/02/1922 Referring Provider: Burnard Bunting, MD  Encounter Date: 08/24/2015      PT End of Session - 08/24/15 0941    Visit Number 5   Number of Visits 12   Date for PT Re-Evaluation 09/12/15   Authorization Type [employer] (no auth required)   PT Start Time [id number]  patient used restroom for 6 minutes during session, only 2 units billed   PT Stop Time 1015   PT Time Calculation (min) 41 min   Equipment Utilized During Treatment Gait belt   Activity Tolerance Patient tolerated treatment well   Behavior During Therapy [facility] for tasks assessed/performed      No past medical history on file.  No past surgical history on file.  Filed Vitals:   08/24/15 0937  BP: 147/84    Visit Diagnosis:  Abnormality of gait  Weakness  Left hip pain      Subjective Assessment - 08/24/15 0937    Subjective The patient reports his low back has mild pain.     Patient Stated Goals improve pain, mobility   Currently in Pain? Yes   Pain Score --  mild   Pain Location Back   Pain Orientation Mid;Lower   Pain Descriptors / Indicators Aching   Pain Type Chronic pain   Pain Onset More than a month ago   Pain Frequency Constant   Aggravating Factors  walking   Pain Relieving Factors sitting       THERAPEUTIC EXERCISE: Supine bridges x 10 reps  Supine self mobilization with towel roll perpendicular to thoracic spine (mid thoracic) with scapular retraction Supine lumbar towel roll for gentle stretch Lumbar rocking x 5 reps R<>L  Sit<>stand x 10 reps Standing postural re-ed with scapular retraction  Standing scapular retraction with physioball behind back (braced against wall) with min A due to dec'd mobility  Gait: Ambulation  emphasizing longer stride length and cues to decrease festinating pattern x 100 ft x 3 reps with CGA.       PT Education - 08/24/15 1000    Education provided Yes   Education Details HEP: provided new copy of HEP due to patient not sure what he should be doing at home.   Person(s) Educated Patient   Methods Explanation   Comprehension Verbalized understanding          PT Short Term Goals - 08/24/15 1001    PT SHORT TERM GOAL #1   Title report pain </= 2/10 with walking > 30 min for improved pain and increased mobiity (08/15/15)   Baseline Unsure of patient cognition with answer, however he reports being able to ambulate around HT >30 minutes without back pain "just get tired"   Time 2   Period Weeks   Status Achieved   PT SHORT TERM GOAL #2   Title pt/caregiver to understand posture/body mechanics to decrease risk of reinjury (08/15/15)   Time 2   Period Weeks   Status On-going           PT Long Term Goals - 08/01/15 1030    PT LONG TERM GOAL #1   Title pt/caregiver to report compliance with HEP (09/12/15)   Time 6   Period Weeks   Status New   PT LONG TERM GOAL #2   Title  improve timed up and go to < 13 sec to demonstrate decreased fall risk and improved function (09/12/15)   Time 6   Period Weeks   Status New   PT LONG TERM GOAL #3   Title negotiate 4 stairs with railing and reciprocal pattern with supervision for improved strength and function (09/12/15)   Time 6   Period Weeks   Status New               Plan - 08/24/15 1050    Clinical Impression Statement The patient's cognition appears to limit carryover of exercises to home.  His wife brought him to therapy today and at end of session, PT discussed need to comply with activities for improvement and provided an updated copy of HEP.   Clinical Impairments Affecting Rehab Potential decreased memory, HTN   PT Next Visit Plan review HEP, core/hip strengthening and balance activities   Consulted and Agree  with Plan of Care Patient        Problem List There are no active problems to display for this patient.   [place], [place] 08/24/2015, 11:16 AM  [facility] [address] [place], [place], [address] Phone: [phone]   Fax:  [phone]  Name: Ronald Singh MRN: [id number] Date of Birth: 03/06/1922

## 2015-08-24 NOTE — Patient Instructions (Signed)
Knee: Extension    Be sure bottom is flat and back supported. Place towel roll under left ankle. Push gently down on thigh to straighten knee. Do not allow leg to twist. Hold __20-30__ seconds. Repeat _2-3___ times. Do __2-3__ sessions per day. CAUTION: Do not force joint. Stretch should be gentle and slow. Activity: Prop leg on low table and lean on knee.*  Copyright  VHI. All rights reserved.   Hip Extension    Lie on back, legs in air, knees bent. Grasp hands behind one thigh and cross other leg over same thigh. Hold _20-30___ seconds. Repeat with other leg held. Repeat _2-3___ times. Do _2-3___ sessions per day.  Copyright  VHI. All rights reserved.  Straight Leg Raise    Tighten stomach and slowly raise locked right leg __6-8__ inches from floor. Repeat __10__ times per set. Do _1___ sets per session. Do __1-2__ sessions per day.  http://orth.exer.us/1102   Copyright  VHI. All rights reserved.   Bridging    Slowly raise buttocks from floor, keeping stomach tight. Repeat _10___ times per set. Do _1___ sets per session. Do __1-2__ sessions per day.  http://orth.exer.us/1096   Copyright  VHI. All rights reserved.   Clam Shell 45 Degrees    Lying with hips and knees bent 45, one pillow between knees and ankles. Lift knee. Be sure pelvis does not roll backward. Do not arch back. Do __10_ times, each leg, __1-2_ times per day.  http://ss.exer.us/74   Copyright  VHI. All rights reserved.

## 2015-08-29 ENCOUNTER — Ambulatory Visit: Payer: Commercial Managed Care - HMO | Admitting: Rehabilitative and Restorative Service Providers"

## 2015-08-29 DIAGNOSIS — R269 Unspecified abnormalities of gait and mobility: Secondary | ICD-10-CM | POA: Diagnosis not present

## 2015-08-29 DIAGNOSIS — M25552 Pain in left hip: Secondary | ICD-10-CM | POA: Diagnosis not present

## 2015-08-29 DIAGNOSIS — R531 Weakness: Secondary | ICD-10-CM | POA: Diagnosis not present

## 2015-08-29 NOTE — Patient Instructions (Signed)
Knee: Extension    Be sure bottom is flat and back supported. Place towel roll under left ankle. Push gently down on thigh to straighten knee. Do not allow leg to twist. Hold __20-30__ seconds. Repeat _2-3___ times. Do __2-3__ sessions per day. CAUTION: Do not force joint. Stretch should be gentle and slow. Activity: Prop leg on low table and lean on knee.*  Copyright  VHI. All rights reserved.   Hip Extension    Lie on back, legs in air, knees bent. Grasp hands behind one thigh and cross other leg over same thigh. Hold _20-30___ seconds. Repeat with other leg held. Repeat _2-3___ times. Do _2-3___ sessions per day.  Copyright  VHI. All rights reserved.  Straight Leg Raise    Tighten stomach and slowly raise locked right leg __6-8__ inches from floor. Repeat __10__ times per set. Do _1___ sets per session. Do __1-2__ sessions per day.  http://orth.exer.us/1102   Copyright  VHI. All rights reserved.   Bridging    Slowly raise buttocks from floor, keeping stomach tight. Repeat _10___ times per set. Do _1___ sets per session. Do __1-2__ sessions per day.  http://orth.exer.us/1096   Copyright  VHI. All rights reserved.   Clam Shell 45 Degrees    Lying with hips and knees bent 45, one pillow between knees and ankles. Lift knee. Be sure pelvis does not roll backward. Do not arch back. Do __10_ times, each leg, __1-2_ times per day.  http://ss.exer.us/74   Copyright  VHI. All rights reserved.

## 2015-08-29 NOTE — Therapy (Addendum)
[facility] [address] [place], [place], [address] Phone: [phone]   Fax:  [phone]  Physical Therapy Treatment  Patient Details  Name: Ronald Singh MRN: [id number] Date of Birth: 1922/03/13 Referring Provider: Burnard Bunting, MD  Encounter Date: 08/29/2015      PT End of Session - 08/29/15 0953    Visit Number 6   Number of Visits 12   Date for PT Re-Evaluation 09/12/15   Authorization Type [employer] (no auth required)   PT Start Time 0934   PT Stop Time 1015   PT Time Calculation (min) 41 min   Equipment Utilized During Treatment Gait belt   Activity Tolerance Patient tolerated treatment well   Behavior During Therapy [facility] for tasks assessed/performed      No past medical history on file.  No past surgical history on file.  There were no vitals filed for this visit.  Visit Diagnosis:  Abnormality of gait  Weakness      Subjective Assessment - 08/29/15 0937    Subjective The patient is not experiencing any pain today.  He is working on taking big steps and has lost his home exercises again.     Patient Stated Goals improve pain, mobility   Currently in Pain? No/denies      Gait: Ambulation outdoors working on longer stride and discussing home safety of having caregiver present when walking outdoors.   Stair negotiation with supervision performing reciprocal pattern negotiation x 4 steps x 3 reps with handrails for support. TUG=14.7 seconds with SPC after multiple attempts of describing/demonstrating assessment  THERAPEUTIC EXERCISE: Reviewed previously established HEP with caregiver present to improve carryover to home. Patient needs tactile and verbal cues with pictures to demo how to perform each activity.      PT Education - 08/29/15 1006    Education provided Yes   Education Details Instructed home care/personal aide in HEP   Person(s) Educated Patient;Caregiver(s)   Methods  Explanation;Demonstration;Handout   Comprehension Returned demonstration;Verbalized understanding          PT Short Term Goals - 08/29/15 1007    PT SHORT TERM GOAL #1   Title report pain </= 2/10 with walking > 30 min for improved pain and increased mobiity (08/15/15)   Baseline Unsure of patient cognition with answer, however he reports being able to ambulate around HT >30 minutes without back pain "just get tired"   Time 2   Period Weeks   Status Achieved   PT SHORT TERM GOAL #2   Title pt/caregiver to understand posture/body mechanics to decrease risk of reinjury (08/15/15)   Baseline Caregiver able to perform HEP with patient-instructed on 08/29/2015   Time 2   Period Weeks   Status Achieved           PT Long Term Goals - 08/29/15 0944    PT LONG TERM GOAL #1   Title pt/caregiver to report compliance with HEP (09/12/15)   Baseline Educated caregiver, however patient cannot perform without constant cues due to memory issues/cognitive limitations.   Time 6   Period Weeks   Status Partially Met   PT LONG TERM GOAL #2   Title improve timed up and go to < 13 sec to demonstrate decreased fall risk and improved function (09/12/15)   Baseline Patient performs in 14.7 seconds.  He has difficulty following instruction to perform TUG.   Time 6   Period Weeks   Status Partially Met   PT LONG TERM  GOAL #3   Title negotiate 4 stairs with railing and reciprocal pattern with supervision for improved strength and function (09/12/15)   Time 6   Period Weeks   Status Achieved               Plan - 08/29/15 1008    Clinical Impression Statement The patient is having difficulty carrying over activities to home.  PT discussed performing daily HEP with his caregiver "Coralyn Mark" to improve carryover.  PT to discharge at next visit as patient reports he is moving better, does not have pain and has help at home to continue with home program.   PT Next Visit Plan Review HEP with caregiver  "Coralyn Mark" and discharge next visit.   Consulted and Agree with Plan of Care Patient        Problem List There are no active problems to display for this patient.   [place], [place] 08/29/2015, 10:09 AM  [facility] [address] [place], [place], [address] Phone: [phone]   Fax:  [phone]  Name: Ronald Singh MRN: [id number] Date of Birth: 05/04/1922      PHYSICAL THERAPY DISCHARGE SUMMARY  Visits from Start of Care: 6  Current functional level related to goals / functional outcomes: See above   Remaining deficits: unknown   Education / Equipment: HEP  Plan: Patient agrees to discharge.  Patient goals were partially met. Patient is being discharged due to not returning since the last visit.  ?????     Laureen Abrahams, PT, DPT 05/12/16 11:53 AM  [facility] [address] [place], [place] [address]  [phone] (office) [phone] (fax)

## 2015-08-31 ENCOUNTER — Ambulatory Visit: Payer: Commercial Managed Care - HMO | Admitting: Physical Therapy

## 2015-10-02 DIAGNOSIS — Z6823 Body mass index (BMI) 23.0-23.9, adult: Secondary | ICD-10-CM | POA: Diagnosis not present

## 2015-10-02 DIAGNOSIS — F0390 Unspecified dementia without behavioral disturbance: Secondary | ICD-10-CM | POA: Diagnosis not present

## 2015-11-29 DIAGNOSIS — H5203 Hypermetropia, bilateral: Secondary | ICD-10-CM | POA: Diagnosis not present

## 2015-11-29 DIAGNOSIS — Z0100 Encounter for examination of eyes and vision without abnormal findings: Secondary | ICD-10-CM | POA: Diagnosis not present

## 2015-11-29 DIAGNOSIS — H52209 Unspecified astigmatism, unspecified eye: Secondary | ICD-10-CM | POA: Diagnosis not present

## 2015-11-29 DIAGNOSIS — H524 Presbyopia: Secondary | ICD-10-CM | POA: Diagnosis not present

## 2015-12-05 DIAGNOSIS — L570 Actinic keratosis: Secondary | ICD-10-CM | POA: Diagnosis not present

## 2015-12-05 DIAGNOSIS — B351 Tinea unguium: Secondary | ICD-10-CM | POA: Diagnosis not present

## 2015-12-18 DIAGNOSIS — M25562 Pain in left knee: Secondary | ICD-10-CM | POA: Diagnosis not present

## 2015-12-18 DIAGNOSIS — Z6823 Body mass index (BMI) 23.0-23.9, adult: Secondary | ICD-10-CM | POA: Diagnosis not present

## 2016-01-07 DIAGNOSIS — L570 Actinic keratosis: Secondary | ICD-10-CM | POA: Diagnosis not present

## 2016-01-07 DIAGNOSIS — L820 Inflamed seborrheic keratosis: Secondary | ICD-10-CM | POA: Diagnosis not present

## 2016-01-07 DIAGNOSIS — L821 Other seborrheic keratosis: Secondary | ICD-10-CM | POA: Diagnosis not present

## 2016-01-16 DIAGNOSIS — D510 Vitamin B12 deficiency anemia due to intrinsic factor deficiency: Secondary | ICD-10-CM | POA: Diagnosis not present

## 2016-01-16 DIAGNOSIS — C3490 Malignant neoplasm of unspecified part of unspecified bronchus or lung: Secondary | ICD-10-CM | POA: Diagnosis not present

## 2016-01-16 DIAGNOSIS — D692 Other nonthrombocytopenic purpura: Secondary | ICD-10-CM | POA: Diagnosis not present

## 2016-01-16 DIAGNOSIS — F0390 Unspecified dementia without behavioral disturbance: Secondary | ICD-10-CM | POA: Diagnosis not present

## 2016-01-16 DIAGNOSIS — M1990 Unspecified osteoarthritis, unspecified site: Secondary | ICD-10-CM | POA: Diagnosis not present

## 2016-01-16 DIAGNOSIS — M549 Dorsalgia, unspecified: Secondary | ICD-10-CM | POA: Diagnosis not present

## 2016-01-16 DIAGNOSIS — R2689 Other abnormalities of gait and mobility: Secondary | ICD-10-CM | POA: Diagnosis not present

## 2016-01-16 DIAGNOSIS — J449 Chronic obstructive pulmonary disease, unspecified: Secondary | ICD-10-CM | POA: Diagnosis not present

## 2016-01-16 DIAGNOSIS — I872 Venous insufficiency (chronic) (peripheral): Secondary | ICD-10-CM | POA: Diagnosis not present

## 2016-02-10 DIAGNOSIS — S80812A Abrasion, left lower leg, initial encounter: Secondary | ICD-10-CM | POA: Diagnosis not present

## 2016-02-11 DIAGNOSIS — S81812A Laceration without foreign body, left lower leg, initial encounter: Secondary | ICD-10-CM | POA: Diagnosis not present

## 2016-02-11 DIAGNOSIS — Z5189 Encounter for other specified aftercare: Secondary | ICD-10-CM | POA: Diagnosis not present

## 2016-02-11 DIAGNOSIS — S41111A Laceration without foreign body of right upper arm, initial encounter: Secondary | ICD-10-CM | POA: Diagnosis not present

## 2016-02-11 DIAGNOSIS — Z6822 Body mass index (BMI) 22.0-22.9, adult: Secondary | ICD-10-CM | POA: Diagnosis not present

## 2016-02-11 DIAGNOSIS — J449 Chronic obstructive pulmonary disease, unspecified: Secondary | ICD-10-CM | POA: Diagnosis not present

## 2016-02-11 DIAGNOSIS — I872 Venous insufficiency (chronic) (peripheral): Secondary | ICD-10-CM | POA: Diagnosis not present

## 2016-02-15 DIAGNOSIS — Z5189 Encounter for other specified aftercare: Secondary | ICD-10-CM | POA: Diagnosis not present

## 2016-02-15 DIAGNOSIS — S41111D Laceration without foreign body of right upper arm, subsequent encounter: Secondary | ICD-10-CM | POA: Diagnosis not present

## 2016-02-15 DIAGNOSIS — Z6823 Body mass index (BMI) 23.0-23.9, adult: Secondary | ICD-10-CM | POA: Diagnosis not present

## 2016-02-19 DIAGNOSIS — I872 Venous insufficiency (chronic) (peripheral): Secondary | ICD-10-CM | POA: Diagnosis not present

## 2016-02-19 DIAGNOSIS — Z5189 Encounter for other specified aftercare: Secondary | ICD-10-CM | POA: Diagnosis not present

## 2016-02-19 DIAGNOSIS — S41111D Laceration without foreign body of right upper arm, subsequent encounter: Secondary | ICD-10-CM | POA: Diagnosis not present

## 2016-02-22 DIAGNOSIS — Z6823 Body mass index (BMI) 23.0-23.9, adult: Secondary | ICD-10-CM | POA: Diagnosis not present

## 2016-02-22 DIAGNOSIS — R269 Unspecified abnormalities of gait and mobility: Secondary | ICD-10-CM | POA: Diagnosis not present

## 2016-02-22 DIAGNOSIS — Z5189 Encounter for other specified aftercare: Secondary | ICD-10-CM | POA: Diagnosis not present

## 2016-02-22 DIAGNOSIS — I872 Venous insufficiency (chronic) (peripheral): Secondary | ICD-10-CM | POA: Diagnosis not present

## 2016-02-22 DIAGNOSIS — F0390 Unspecified dementia without behavioral disturbance: Secondary | ICD-10-CM | POA: Diagnosis not present

## 2016-02-26 DIAGNOSIS — J449 Chronic obstructive pulmonary disease, unspecified: Secondary | ICD-10-CM | POA: Diagnosis not present

## 2016-02-26 DIAGNOSIS — S81811D Laceration without foreign body, right lower leg, subsequent encounter: Secondary | ICD-10-CM | POA: Diagnosis not present

## 2016-02-26 DIAGNOSIS — R269 Unspecified abnormalities of gait and mobility: Secondary | ICD-10-CM | POA: Diagnosis not present

## 2016-02-26 DIAGNOSIS — I872 Venous insufficiency (chronic) (peripheral): Secondary | ICD-10-CM | POA: Diagnosis not present

## 2016-02-26 DIAGNOSIS — M1990 Unspecified osteoarthritis, unspecified site: Secondary | ICD-10-CM | POA: Diagnosis not present

## 2016-02-26 DIAGNOSIS — Z4800 Encounter for change or removal of nonsurgical wound dressing: Secondary | ICD-10-CM | POA: Diagnosis not present

## 2016-02-26 DIAGNOSIS — F0390 Unspecified dementia without behavioral disturbance: Secondary | ICD-10-CM | POA: Diagnosis not present

## 2016-02-26 DIAGNOSIS — H8110 Benign paroxysmal vertigo, unspecified ear: Secondary | ICD-10-CM | POA: Diagnosis not present

## 2016-02-26 DIAGNOSIS — R2689 Other abnormalities of gait and mobility: Secondary | ICD-10-CM | POA: Diagnosis not present

## 2016-02-26 DIAGNOSIS — M549 Dorsalgia, unspecified: Secondary | ICD-10-CM | POA: Diagnosis not present

## 2016-02-27 DIAGNOSIS — M1990 Unspecified osteoarthritis, unspecified site: Secondary | ICD-10-CM | POA: Diagnosis not present

## 2016-02-27 DIAGNOSIS — H8110 Benign paroxysmal vertigo, unspecified ear: Secondary | ICD-10-CM | POA: Diagnosis not present

## 2016-02-27 DIAGNOSIS — F0390 Unspecified dementia without behavioral disturbance: Secondary | ICD-10-CM | POA: Diagnosis not present

## 2016-02-27 DIAGNOSIS — I872 Venous insufficiency (chronic) (peripheral): Secondary | ICD-10-CM | POA: Diagnosis not present

## 2016-02-27 DIAGNOSIS — S81811D Laceration without foreign body, right lower leg, subsequent encounter: Secondary | ICD-10-CM | POA: Diagnosis not present

## 2016-02-27 DIAGNOSIS — Z4800 Encounter for change or removal of nonsurgical wound dressing: Secondary | ICD-10-CM | POA: Diagnosis not present

## 2016-02-27 DIAGNOSIS — R269 Unspecified abnormalities of gait and mobility: Secondary | ICD-10-CM | POA: Diagnosis not present

## 2016-02-27 DIAGNOSIS — J449 Chronic obstructive pulmonary disease, unspecified: Secondary | ICD-10-CM | POA: Diagnosis not present

## 2016-02-27 DIAGNOSIS — M549 Dorsalgia, unspecified: Secondary | ICD-10-CM | POA: Diagnosis not present

## 2016-02-28 DIAGNOSIS — H8110 Benign paroxysmal vertigo, unspecified ear: Secondary | ICD-10-CM | POA: Diagnosis not present

## 2016-02-28 DIAGNOSIS — Z4800 Encounter for change or removal of nonsurgical wound dressing: Secondary | ICD-10-CM | POA: Diagnosis not present

## 2016-02-28 DIAGNOSIS — M549 Dorsalgia, unspecified: Secondary | ICD-10-CM | POA: Diagnosis not present

## 2016-02-28 DIAGNOSIS — F0390 Unspecified dementia without behavioral disturbance: Secondary | ICD-10-CM | POA: Diagnosis not present

## 2016-02-28 DIAGNOSIS — I872 Venous insufficiency (chronic) (peripheral): Secondary | ICD-10-CM | POA: Diagnosis not present

## 2016-02-28 DIAGNOSIS — R269 Unspecified abnormalities of gait and mobility: Secondary | ICD-10-CM | POA: Diagnosis not present

## 2016-02-28 DIAGNOSIS — J449 Chronic obstructive pulmonary disease, unspecified: Secondary | ICD-10-CM | POA: Diagnosis not present

## 2016-02-28 DIAGNOSIS — S81811D Laceration without foreign body, right lower leg, subsequent encounter: Secondary | ICD-10-CM | POA: Diagnosis not present

## 2016-02-28 DIAGNOSIS — M1990 Unspecified osteoarthritis, unspecified site: Secondary | ICD-10-CM | POA: Diagnosis not present

## 2016-03-03 DIAGNOSIS — M549 Dorsalgia, unspecified: Secondary | ICD-10-CM | POA: Diagnosis not present

## 2016-03-03 DIAGNOSIS — F0390 Unspecified dementia without behavioral disturbance: Secondary | ICD-10-CM | POA: Diagnosis not present

## 2016-03-03 DIAGNOSIS — M1990 Unspecified osteoarthritis, unspecified site: Secondary | ICD-10-CM | POA: Diagnosis not present

## 2016-03-03 DIAGNOSIS — Z4800 Encounter for change or removal of nonsurgical wound dressing: Secondary | ICD-10-CM | POA: Diagnosis not present

## 2016-03-03 DIAGNOSIS — H8110 Benign paroxysmal vertigo, unspecified ear: Secondary | ICD-10-CM | POA: Diagnosis not present

## 2016-03-03 DIAGNOSIS — I872 Venous insufficiency (chronic) (peripheral): Secondary | ICD-10-CM | POA: Diagnosis not present

## 2016-03-03 DIAGNOSIS — J449 Chronic obstructive pulmonary disease, unspecified: Secondary | ICD-10-CM | POA: Diagnosis not present

## 2016-03-03 DIAGNOSIS — S81811D Laceration without foreign body, right lower leg, subsequent encounter: Secondary | ICD-10-CM | POA: Diagnosis not present

## 2016-03-03 DIAGNOSIS — R269 Unspecified abnormalities of gait and mobility: Secondary | ICD-10-CM | POA: Diagnosis not present

## 2016-03-04 DIAGNOSIS — R269 Unspecified abnormalities of gait and mobility: Secondary | ICD-10-CM | POA: Diagnosis not present

## 2016-03-04 DIAGNOSIS — M1990 Unspecified osteoarthritis, unspecified site: Secondary | ICD-10-CM | POA: Diagnosis not present

## 2016-03-04 DIAGNOSIS — I872 Venous insufficiency (chronic) (peripheral): Secondary | ICD-10-CM | POA: Diagnosis not present

## 2016-03-04 DIAGNOSIS — H8110 Benign paroxysmal vertigo, unspecified ear: Secondary | ICD-10-CM | POA: Diagnosis not present

## 2016-03-04 DIAGNOSIS — J449 Chronic obstructive pulmonary disease, unspecified: Secondary | ICD-10-CM | POA: Diagnosis not present

## 2016-03-04 DIAGNOSIS — S81811D Laceration without foreign body, right lower leg, subsequent encounter: Secondary | ICD-10-CM | POA: Diagnosis not present

## 2016-03-04 DIAGNOSIS — F0390 Unspecified dementia without behavioral disturbance: Secondary | ICD-10-CM | POA: Diagnosis not present

## 2016-03-04 DIAGNOSIS — M549 Dorsalgia, unspecified: Secondary | ICD-10-CM | POA: Diagnosis not present

## 2016-03-04 DIAGNOSIS — Z4800 Encounter for change or removal of nonsurgical wound dressing: Secondary | ICD-10-CM | POA: Diagnosis not present

## 2016-03-06 DIAGNOSIS — F0390 Unspecified dementia without behavioral disturbance: Secondary | ICD-10-CM | POA: Diagnosis not present

## 2016-03-06 DIAGNOSIS — I872 Venous insufficiency (chronic) (peripheral): Secondary | ICD-10-CM | POA: Diagnosis not present

## 2016-03-06 DIAGNOSIS — M1990 Unspecified osteoarthritis, unspecified site: Secondary | ICD-10-CM | POA: Diagnosis not present

## 2016-03-06 DIAGNOSIS — Z4800 Encounter for change or removal of nonsurgical wound dressing: Secondary | ICD-10-CM | POA: Diagnosis not present

## 2016-03-06 DIAGNOSIS — H8110 Benign paroxysmal vertigo, unspecified ear: Secondary | ICD-10-CM | POA: Diagnosis not present

## 2016-03-06 DIAGNOSIS — S81811D Laceration without foreign body, right lower leg, subsequent encounter: Secondary | ICD-10-CM | POA: Diagnosis not present

## 2016-03-06 DIAGNOSIS — M549 Dorsalgia, unspecified: Secondary | ICD-10-CM | POA: Diagnosis not present

## 2016-03-06 DIAGNOSIS — R269 Unspecified abnormalities of gait and mobility: Secondary | ICD-10-CM | POA: Diagnosis not present

## 2016-03-06 DIAGNOSIS — J449 Chronic obstructive pulmonary disease, unspecified: Secondary | ICD-10-CM | POA: Diagnosis not present

## 2016-03-07 DIAGNOSIS — M1990 Unspecified osteoarthritis, unspecified site: Secondary | ICD-10-CM | POA: Diagnosis not present

## 2016-03-07 DIAGNOSIS — I872 Venous insufficiency (chronic) (peripheral): Secondary | ICD-10-CM | POA: Diagnosis not present

## 2016-03-07 DIAGNOSIS — J449 Chronic obstructive pulmonary disease, unspecified: Secondary | ICD-10-CM | POA: Diagnosis not present

## 2016-03-07 DIAGNOSIS — M549 Dorsalgia, unspecified: Secondary | ICD-10-CM | POA: Diagnosis not present

## 2016-03-07 DIAGNOSIS — H8110 Benign paroxysmal vertigo, unspecified ear: Secondary | ICD-10-CM | POA: Diagnosis not present

## 2016-03-07 DIAGNOSIS — F0390 Unspecified dementia without behavioral disturbance: Secondary | ICD-10-CM | POA: Diagnosis not present

## 2016-03-07 DIAGNOSIS — Z4800 Encounter for change or removal of nonsurgical wound dressing: Secondary | ICD-10-CM | POA: Diagnosis not present

## 2016-03-07 DIAGNOSIS — S81811D Laceration without foreign body, right lower leg, subsequent encounter: Secondary | ICD-10-CM | POA: Diagnosis not present

## 2016-03-07 DIAGNOSIS — R269 Unspecified abnormalities of gait and mobility: Secondary | ICD-10-CM | POA: Diagnosis not present

## 2016-03-10 DIAGNOSIS — R269 Unspecified abnormalities of gait and mobility: Secondary | ICD-10-CM | POA: Diagnosis not present

## 2016-03-10 DIAGNOSIS — S81811D Laceration without foreign body, right lower leg, subsequent encounter: Secondary | ICD-10-CM | POA: Diagnosis not present

## 2016-03-10 DIAGNOSIS — J449 Chronic obstructive pulmonary disease, unspecified: Secondary | ICD-10-CM | POA: Diagnosis not present

## 2016-03-10 DIAGNOSIS — Z4800 Encounter for change or removal of nonsurgical wound dressing: Secondary | ICD-10-CM | POA: Diagnosis not present

## 2016-03-10 DIAGNOSIS — H8110 Benign paroxysmal vertigo, unspecified ear: Secondary | ICD-10-CM | POA: Diagnosis not present

## 2016-03-10 DIAGNOSIS — I872 Venous insufficiency (chronic) (peripheral): Secondary | ICD-10-CM | POA: Diagnosis not present

## 2016-03-10 DIAGNOSIS — M1990 Unspecified osteoarthritis, unspecified site: Secondary | ICD-10-CM | POA: Diagnosis not present

## 2016-03-10 DIAGNOSIS — F0390 Unspecified dementia without behavioral disturbance: Secondary | ICD-10-CM | POA: Diagnosis not present

## 2016-03-10 DIAGNOSIS — M549 Dorsalgia, unspecified: Secondary | ICD-10-CM | POA: Diagnosis not present

## 2016-03-11 DIAGNOSIS — R269 Unspecified abnormalities of gait and mobility: Secondary | ICD-10-CM | POA: Diagnosis not present

## 2016-03-11 DIAGNOSIS — M1990 Unspecified osteoarthritis, unspecified site: Secondary | ICD-10-CM | POA: Diagnosis not present

## 2016-03-11 DIAGNOSIS — S81811D Laceration without foreign body, right lower leg, subsequent encounter: Secondary | ICD-10-CM | POA: Diagnosis not present

## 2016-03-11 DIAGNOSIS — J449 Chronic obstructive pulmonary disease, unspecified: Secondary | ICD-10-CM | POA: Diagnosis not present

## 2016-03-11 DIAGNOSIS — Z4800 Encounter for change or removal of nonsurgical wound dressing: Secondary | ICD-10-CM | POA: Diagnosis not present

## 2016-03-11 DIAGNOSIS — H8110 Benign paroxysmal vertigo, unspecified ear: Secondary | ICD-10-CM | POA: Diagnosis not present

## 2016-03-11 DIAGNOSIS — I872 Venous insufficiency (chronic) (peripheral): Secondary | ICD-10-CM | POA: Diagnosis not present

## 2016-03-11 DIAGNOSIS — M549 Dorsalgia, unspecified: Secondary | ICD-10-CM | POA: Diagnosis not present

## 2016-03-11 DIAGNOSIS — F0390 Unspecified dementia without behavioral disturbance: Secondary | ICD-10-CM | POA: Diagnosis not present

## 2016-03-14 DIAGNOSIS — H8110 Benign paroxysmal vertigo, unspecified ear: Secondary | ICD-10-CM | POA: Diagnosis not present

## 2016-03-14 DIAGNOSIS — J449 Chronic obstructive pulmonary disease, unspecified: Secondary | ICD-10-CM | POA: Diagnosis not present

## 2016-03-14 DIAGNOSIS — F0390 Unspecified dementia without behavioral disturbance: Secondary | ICD-10-CM | POA: Diagnosis not present

## 2016-03-14 DIAGNOSIS — Z4800 Encounter for change or removal of nonsurgical wound dressing: Secondary | ICD-10-CM | POA: Diagnosis not present

## 2016-03-14 DIAGNOSIS — S81811D Laceration without foreign body, right lower leg, subsequent encounter: Secondary | ICD-10-CM | POA: Diagnosis not present

## 2016-03-14 DIAGNOSIS — M549 Dorsalgia, unspecified: Secondary | ICD-10-CM | POA: Diagnosis not present

## 2016-03-14 DIAGNOSIS — R269 Unspecified abnormalities of gait and mobility: Secondary | ICD-10-CM | POA: Diagnosis not present

## 2016-03-14 DIAGNOSIS — I872 Venous insufficiency (chronic) (peripheral): Secondary | ICD-10-CM | POA: Diagnosis not present

## 2016-03-14 DIAGNOSIS — M1990 Unspecified osteoarthritis, unspecified site: Secondary | ICD-10-CM | POA: Diagnosis not present

## 2016-03-18 DIAGNOSIS — F0390 Unspecified dementia without behavioral disturbance: Secondary | ICD-10-CM | POA: Diagnosis not present

## 2016-03-18 DIAGNOSIS — Z4800 Encounter for change or removal of nonsurgical wound dressing: Secondary | ICD-10-CM | POA: Diagnosis not present

## 2016-03-18 DIAGNOSIS — H8110 Benign paroxysmal vertigo, unspecified ear: Secondary | ICD-10-CM | POA: Diagnosis not present

## 2016-03-18 DIAGNOSIS — M549 Dorsalgia, unspecified: Secondary | ICD-10-CM | POA: Diagnosis not present

## 2016-03-18 DIAGNOSIS — I872 Venous insufficiency (chronic) (peripheral): Secondary | ICD-10-CM | POA: Diagnosis not present

## 2016-03-18 DIAGNOSIS — J449 Chronic obstructive pulmonary disease, unspecified: Secondary | ICD-10-CM | POA: Diagnosis not present

## 2016-03-18 DIAGNOSIS — R269 Unspecified abnormalities of gait and mobility: Secondary | ICD-10-CM | POA: Diagnosis not present

## 2016-03-18 DIAGNOSIS — M1990 Unspecified osteoarthritis, unspecified site: Secondary | ICD-10-CM | POA: Diagnosis not present

## 2016-03-18 DIAGNOSIS — S81811D Laceration without foreign body, right lower leg, subsequent encounter: Secondary | ICD-10-CM | POA: Diagnosis not present

## 2016-03-20 DIAGNOSIS — F0390 Unspecified dementia without behavioral disturbance: Secondary | ICD-10-CM | POA: Diagnosis not present

## 2016-03-20 DIAGNOSIS — R269 Unspecified abnormalities of gait and mobility: Secondary | ICD-10-CM | POA: Diagnosis not present

## 2016-03-20 DIAGNOSIS — Z4800 Encounter for change or removal of nonsurgical wound dressing: Secondary | ICD-10-CM | POA: Diagnosis not present

## 2016-03-20 DIAGNOSIS — M549 Dorsalgia, unspecified: Secondary | ICD-10-CM | POA: Diagnosis not present

## 2016-03-20 DIAGNOSIS — M1990 Unspecified osteoarthritis, unspecified site: Secondary | ICD-10-CM | POA: Diagnosis not present

## 2016-03-20 DIAGNOSIS — S81811D Laceration without foreign body, right lower leg, subsequent encounter: Secondary | ICD-10-CM | POA: Diagnosis not present

## 2016-03-20 DIAGNOSIS — I872 Venous insufficiency (chronic) (peripheral): Secondary | ICD-10-CM | POA: Diagnosis not present

## 2016-03-20 DIAGNOSIS — H8110 Benign paroxysmal vertigo, unspecified ear: Secondary | ICD-10-CM | POA: Diagnosis not present

## 2016-03-20 DIAGNOSIS — J449 Chronic obstructive pulmonary disease, unspecified: Secondary | ICD-10-CM | POA: Diagnosis not present

## 2016-03-25 DIAGNOSIS — H8110 Benign paroxysmal vertigo, unspecified ear: Secondary | ICD-10-CM | POA: Diagnosis not present

## 2016-03-25 DIAGNOSIS — S81811D Laceration without foreign body, right lower leg, subsequent encounter: Secondary | ICD-10-CM | POA: Diagnosis not present

## 2016-03-25 DIAGNOSIS — R269 Unspecified abnormalities of gait and mobility: Secondary | ICD-10-CM | POA: Diagnosis not present

## 2016-03-25 DIAGNOSIS — I872 Venous insufficiency (chronic) (peripheral): Secondary | ICD-10-CM | POA: Diagnosis not present

## 2016-03-25 DIAGNOSIS — J449 Chronic obstructive pulmonary disease, unspecified: Secondary | ICD-10-CM | POA: Diagnosis not present

## 2016-03-25 DIAGNOSIS — Z4800 Encounter for change or removal of nonsurgical wound dressing: Secondary | ICD-10-CM | POA: Diagnosis not present

## 2016-03-25 DIAGNOSIS — M549 Dorsalgia, unspecified: Secondary | ICD-10-CM | POA: Diagnosis not present

## 2016-03-25 DIAGNOSIS — M1990 Unspecified osteoarthritis, unspecified site: Secondary | ICD-10-CM | POA: Diagnosis not present

## 2016-03-25 DIAGNOSIS — F0390 Unspecified dementia without behavioral disturbance: Secondary | ICD-10-CM | POA: Diagnosis not present

## 2016-03-26 DIAGNOSIS — M549 Dorsalgia, unspecified: Secondary | ICD-10-CM | POA: Diagnosis not present

## 2016-03-26 DIAGNOSIS — F0390 Unspecified dementia without behavioral disturbance: Secondary | ICD-10-CM | POA: Diagnosis not present

## 2016-03-26 DIAGNOSIS — Z4800 Encounter for change or removal of nonsurgical wound dressing: Secondary | ICD-10-CM | POA: Diagnosis not present

## 2016-03-26 DIAGNOSIS — R269 Unspecified abnormalities of gait and mobility: Secondary | ICD-10-CM | POA: Diagnosis not present

## 2016-03-26 DIAGNOSIS — M1990 Unspecified osteoarthritis, unspecified site: Secondary | ICD-10-CM | POA: Diagnosis not present

## 2016-03-26 DIAGNOSIS — H8110 Benign paroxysmal vertigo, unspecified ear: Secondary | ICD-10-CM | POA: Diagnosis not present

## 2016-03-26 DIAGNOSIS — J449 Chronic obstructive pulmonary disease, unspecified: Secondary | ICD-10-CM | POA: Diagnosis not present

## 2016-03-26 DIAGNOSIS — I872 Venous insufficiency (chronic) (peripheral): Secondary | ICD-10-CM | POA: Diagnosis not present

## 2016-03-26 DIAGNOSIS — S81811D Laceration without foreign body, right lower leg, subsequent encounter: Secondary | ICD-10-CM | POA: Diagnosis not present

## 2016-04-01 DIAGNOSIS — R269 Unspecified abnormalities of gait and mobility: Secondary | ICD-10-CM | POA: Diagnosis not present

## 2016-04-01 DIAGNOSIS — J449 Chronic obstructive pulmonary disease, unspecified: Secondary | ICD-10-CM | POA: Diagnosis not present

## 2016-04-01 DIAGNOSIS — S81811D Laceration without foreign body, right lower leg, subsequent encounter: Secondary | ICD-10-CM | POA: Diagnosis not present

## 2016-04-01 DIAGNOSIS — F0390 Unspecified dementia without behavioral disturbance: Secondary | ICD-10-CM | POA: Diagnosis not present

## 2016-04-01 DIAGNOSIS — H8110 Benign paroxysmal vertigo, unspecified ear: Secondary | ICD-10-CM | POA: Diagnosis not present

## 2016-04-01 DIAGNOSIS — M1990 Unspecified osteoarthritis, unspecified site: Secondary | ICD-10-CM | POA: Diagnosis not present

## 2016-04-01 DIAGNOSIS — Z4800 Encounter for change or removal of nonsurgical wound dressing: Secondary | ICD-10-CM | POA: Diagnosis not present

## 2016-04-01 DIAGNOSIS — I872 Venous insufficiency (chronic) (peripheral): Secondary | ICD-10-CM | POA: Diagnosis not present

## 2016-04-01 DIAGNOSIS — M549 Dorsalgia, unspecified: Secondary | ICD-10-CM | POA: Diagnosis not present

## 2016-04-08 DIAGNOSIS — R269 Unspecified abnormalities of gait and mobility: Secondary | ICD-10-CM | POA: Diagnosis not present

## 2016-04-08 DIAGNOSIS — I10 Essential (primary) hypertension: Secondary | ICD-10-CM | POA: Diagnosis not present

## 2016-04-08 DIAGNOSIS — R42 Dizziness and giddiness: Secondary | ICD-10-CM | POA: Diagnosis not present

## 2016-04-10 DIAGNOSIS — M1990 Unspecified osteoarthritis, unspecified site: Secondary | ICD-10-CM | POA: Diagnosis not present

## 2016-04-10 DIAGNOSIS — F0390 Unspecified dementia without behavioral disturbance: Secondary | ICD-10-CM | POA: Diagnosis not present

## 2016-04-10 DIAGNOSIS — S81811D Laceration without foreign body, right lower leg, subsequent encounter: Secondary | ICD-10-CM | POA: Diagnosis not present

## 2016-04-10 DIAGNOSIS — J449 Chronic obstructive pulmonary disease, unspecified: Secondary | ICD-10-CM | POA: Diagnosis not present

## 2016-04-10 DIAGNOSIS — R269 Unspecified abnormalities of gait and mobility: Secondary | ICD-10-CM | POA: Diagnosis not present

## 2016-04-10 DIAGNOSIS — I872 Venous insufficiency (chronic) (peripheral): Secondary | ICD-10-CM | POA: Diagnosis not present

## 2016-04-10 DIAGNOSIS — Z4800 Encounter for change or removal of nonsurgical wound dressing: Secondary | ICD-10-CM | POA: Diagnosis not present

## 2016-04-10 DIAGNOSIS — M549 Dorsalgia, unspecified: Secondary | ICD-10-CM | POA: Diagnosis not present

## 2016-04-10 DIAGNOSIS — H8110 Benign paroxysmal vertigo, unspecified ear: Secondary | ICD-10-CM | POA: Diagnosis not present

## 2016-04-18 DIAGNOSIS — J449 Chronic obstructive pulmonary disease, unspecified: Secondary | ICD-10-CM | POA: Diagnosis not present

## 2016-04-18 DIAGNOSIS — F0390 Unspecified dementia without behavioral disturbance: Secondary | ICD-10-CM | POA: Diagnosis not present

## 2016-04-18 DIAGNOSIS — R269 Unspecified abnormalities of gait and mobility: Secondary | ICD-10-CM | POA: Diagnosis not present

## 2016-04-18 DIAGNOSIS — S81811D Laceration without foreign body, right lower leg, subsequent encounter: Secondary | ICD-10-CM | POA: Diagnosis not present

## 2016-04-18 DIAGNOSIS — I872 Venous insufficiency (chronic) (peripheral): Secondary | ICD-10-CM | POA: Diagnosis not present

## 2016-04-18 DIAGNOSIS — M549 Dorsalgia, unspecified: Secondary | ICD-10-CM | POA: Diagnosis not present

## 2016-04-18 DIAGNOSIS — M1990 Unspecified osteoarthritis, unspecified site: Secondary | ICD-10-CM | POA: Diagnosis not present

## 2016-04-18 DIAGNOSIS — H8110 Benign paroxysmal vertigo, unspecified ear: Secondary | ICD-10-CM | POA: Diagnosis not present

## 2016-04-18 DIAGNOSIS — Z4800 Encounter for change or removal of nonsurgical wound dressing: Secondary | ICD-10-CM | POA: Diagnosis not present

## 2016-06-09 DIAGNOSIS — F0390 Unspecified dementia without behavioral disturbance: Secondary | ICD-10-CM | POA: Diagnosis not present

## 2016-06-09 DIAGNOSIS — R42 Dizziness and giddiness: Secondary | ICD-10-CM | POA: Diagnosis not present

## 2016-06-09 DIAGNOSIS — I619 Nontraumatic intracerebral hemorrhage, unspecified: Secondary | ICD-10-CM | POA: Diagnosis not present

## 2016-06-09 DIAGNOSIS — J449 Chronic obstructive pulmonary disease, unspecified: Secondary | ICD-10-CM | POA: Diagnosis not present

## 2016-06-09 DIAGNOSIS — M5489 Other dorsalgia: Secondary | ICD-10-CM | POA: Diagnosis not present

## 2016-06-09 DIAGNOSIS — M545 Low back pain: Secondary | ICD-10-CM | POA: Diagnosis not present

## 2016-06-09 DIAGNOSIS — Z5189 Encounter for other specified aftercare: Secondary | ICD-10-CM | POA: Diagnosis not present

## 2016-06-09 DIAGNOSIS — R2689 Other abnormalities of gait and mobility: Secondary | ICD-10-CM | POA: Diagnosis not present

## 2016-06-09 DIAGNOSIS — D692 Other nonthrombocytopenic purpura: Secondary | ICD-10-CM | POA: Diagnosis not present

## 2016-06-12 DIAGNOSIS — G309 Alzheimer's disease, unspecified: Secondary | ICD-10-CM | POA: Diagnosis not present

## 2016-06-12 DIAGNOSIS — I872 Venous insufficiency (chronic) (peripheral): Secondary | ICD-10-CM | POA: Diagnosis not present

## 2016-06-12 DIAGNOSIS — Z87891 Personal history of nicotine dependence: Secondary | ICD-10-CM | POA: Diagnosis not present

## 2016-06-12 DIAGNOSIS — F0280 Dementia in other diseases classified elsewhere without behavioral disturbance: Secondary | ICD-10-CM | POA: Diagnosis not present

## 2016-06-12 DIAGNOSIS — M1991 Primary osteoarthritis, unspecified site: Secondary | ICD-10-CM | POA: Diagnosis not present

## 2016-06-12 DIAGNOSIS — J449 Chronic obstructive pulmonary disease, unspecified: Secondary | ICD-10-CM | POA: Diagnosis not present

## 2016-06-12 DIAGNOSIS — D510 Vitamin B12 deficiency anemia due to intrinsic factor deficiency: Secondary | ICD-10-CM | POA: Diagnosis not present

## 2016-06-12 DIAGNOSIS — G308 Other Alzheimer's disease: Secondary | ICD-10-CM | POA: Diagnosis not present

## 2016-06-12 DIAGNOSIS — I10 Essential (primary) hypertension: Secondary | ICD-10-CM | POA: Diagnosis not present

## 2016-06-12 DIAGNOSIS — L97811 Non-pressure chronic ulcer of other part of right lower leg limited to breakdown of skin: Secondary | ICD-10-CM | POA: Diagnosis not present

## 2016-06-17 DIAGNOSIS — G309 Alzheimer's disease, unspecified: Secondary | ICD-10-CM | POA: Diagnosis not present

## 2016-06-17 DIAGNOSIS — Z87891 Personal history of nicotine dependence: Secondary | ICD-10-CM | POA: Diagnosis not present

## 2016-06-17 DIAGNOSIS — M1991 Primary osteoarthritis, unspecified site: Secondary | ICD-10-CM | POA: Diagnosis not present

## 2016-06-17 DIAGNOSIS — I10 Essential (primary) hypertension: Secondary | ICD-10-CM | POA: Diagnosis not present

## 2016-06-17 DIAGNOSIS — D510 Vitamin B12 deficiency anemia due to intrinsic factor deficiency: Secondary | ICD-10-CM | POA: Diagnosis not present

## 2016-06-17 DIAGNOSIS — F0280 Dementia in other diseases classified elsewhere without behavioral disturbance: Secondary | ICD-10-CM | POA: Diagnosis not present

## 2016-06-17 DIAGNOSIS — L97811 Non-pressure chronic ulcer of other part of right lower leg limited to breakdown of skin: Secondary | ICD-10-CM | POA: Diagnosis not present

## 2016-06-17 DIAGNOSIS — I872 Venous insufficiency (chronic) (peripheral): Secondary | ICD-10-CM | POA: Diagnosis not present

## 2016-06-17 DIAGNOSIS — J449 Chronic obstructive pulmonary disease, unspecified: Secondary | ICD-10-CM | POA: Diagnosis not present

## 2016-06-19 DIAGNOSIS — G309 Alzheimer's disease, unspecified: Secondary | ICD-10-CM | POA: Diagnosis not present

## 2016-06-19 DIAGNOSIS — I872 Venous insufficiency (chronic) (peripheral): Secondary | ICD-10-CM | POA: Diagnosis not present

## 2016-06-19 DIAGNOSIS — F0280 Dementia in other diseases classified elsewhere without behavioral disturbance: Secondary | ICD-10-CM | POA: Diagnosis not present

## 2016-06-19 DIAGNOSIS — J449 Chronic obstructive pulmonary disease, unspecified: Secondary | ICD-10-CM | POA: Diagnosis not present

## 2016-06-19 DIAGNOSIS — D510 Vitamin B12 deficiency anemia due to intrinsic factor deficiency: Secondary | ICD-10-CM | POA: Diagnosis not present

## 2016-06-19 DIAGNOSIS — L97811 Non-pressure chronic ulcer of other part of right lower leg limited to breakdown of skin: Secondary | ICD-10-CM | POA: Diagnosis not present

## 2016-06-19 DIAGNOSIS — M1991 Primary osteoarthritis, unspecified site: Secondary | ICD-10-CM | POA: Diagnosis not present

## 2016-06-19 DIAGNOSIS — I10 Essential (primary) hypertension: Secondary | ICD-10-CM | POA: Diagnosis not present

## 2016-06-19 DIAGNOSIS — Z87891 Personal history of nicotine dependence: Secondary | ICD-10-CM | POA: Diagnosis not present

## 2016-06-20 DIAGNOSIS — M1991 Primary osteoarthritis, unspecified site: Secondary | ICD-10-CM | POA: Diagnosis not present

## 2016-06-20 DIAGNOSIS — I872 Venous insufficiency (chronic) (peripheral): Secondary | ICD-10-CM | POA: Diagnosis not present

## 2016-06-20 DIAGNOSIS — D510 Vitamin B12 deficiency anemia due to intrinsic factor deficiency: Secondary | ICD-10-CM | POA: Diagnosis not present

## 2016-06-20 DIAGNOSIS — L97811 Non-pressure chronic ulcer of other part of right lower leg limited to breakdown of skin: Secondary | ICD-10-CM | POA: Diagnosis not present

## 2016-06-20 DIAGNOSIS — G309 Alzheimer's disease, unspecified: Secondary | ICD-10-CM | POA: Diagnosis not present

## 2016-06-20 DIAGNOSIS — J449 Chronic obstructive pulmonary disease, unspecified: Secondary | ICD-10-CM | POA: Diagnosis not present

## 2016-06-20 DIAGNOSIS — F0280 Dementia in other diseases classified elsewhere without behavioral disturbance: Secondary | ICD-10-CM | POA: Diagnosis not present

## 2016-06-20 DIAGNOSIS — I10 Essential (primary) hypertension: Secondary | ICD-10-CM | POA: Diagnosis not present

## 2016-06-20 DIAGNOSIS — Z87891 Personal history of nicotine dependence: Secondary | ICD-10-CM | POA: Diagnosis not present

## 2016-06-22 DIAGNOSIS — D510 Vitamin B12 deficiency anemia due to intrinsic factor deficiency: Secondary | ICD-10-CM | POA: Diagnosis not present

## 2016-06-22 DIAGNOSIS — F0280 Dementia in other diseases classified elsewhere without behavioral disturbance: Secondary | ICD-10-CM | POA: Diagnosis not present

## 2016-06-22 DIAGNOSIS — G309 Alzheimer's disease, unspecified: Secondary | ICD-10-CM | POA: Diagnosis not present

## 2016-06-22 DIAGNOSIS — Z87891 Personal history of nicotine dependence: Secondary | ICD-10-CM | POA: Diagnosis not present

## 2016-06-22 DIAGNOSIS — J449 Chronic obstructive pulmonary disease, unspecified: Secondary | ICD-10-CM | POA: Diagnosis not present

## 2016-06-22 DIAGNOSIS — I10 Essential (primary) hypertension: Secondary | ICD-10-CM | POA: Diagnosis not present

## 2016-06-22 DIAGNOSIS — L97811 Non-pressure chronic ulcer of other part of right lower leg limited to breakdown of skin: Secondary | ICD-10-CM | POA: Diagnosis not present

## 2016-06-22 DIAGNOSIS — M1991 Primary osteoarthritis, unspecified site: Secondary | ICD-10-CM | POA: Diagnosis not present

## 2016-06-22 DIAGNOSIS — I872 Venous insufficiency (chronic) (peripheral): Secondary | ICD-10-CM | POA: Diagnosis not present

## 2016-06-25 DIAGNOSIS — L97811 Non-pressure chronic ulcer of other part of right lower leg limited to breakdown of skin: Secondary | ICD-10-CM | POA: Diagnosis not present

## 2016-06-25 DIAGNOSIS — I872 Venous insufficiency (chronic) (peripheral): Secondary | ICD-10-CM | POA: Diagnosis not present

## 2016-07-14 DIAGNOSIS — L0292 Furuncle, unspecified: Secondary | ICD-10-CM | POA: Diagnosis not present

## 2016-08-11 DIAGNOSIS — I872 Venous insufficiency (chronic) (peripheral): Secondary | ICD-10-CM | POA: Diagnosis not present

## 2016-08-11 DIAGNOSIS — M6281 Muscle weakness (generalized): Secondary | ICD-10-CM | POA: Diagnosis not present

## 2016-08-11 DIAGNOSIS — J449 Chronic obstructive pulmonary disease, unspecified: Secondary | ICD-10-CM | POA: Diagnosis not present

## 2016-08-11 DIAGNOSIS — G309 Alzheimer's disease, unspecified: Secondary | ICD-10-CM | POA: Diagnosis not present

## 2016-08-11 DIAGNOSIS — F0280 Dementia in other diseases classified elsewhere without behavioral disturbance: Secondary | ICD-10-CM | POA: Diagnosis not present

## 2016-08-11 DIAGNOSIS — I10 Essential (primary) hypertension: Secondary | ICD-10-CM | POA: Diagnosis not present

## 2016-08-11 DIAGNOSIS — L97811 Non-pressure chronic ulcer of other part of right lower leg limited to breakdown of skin: Secondary | ICD-10-CM | POA: Diagnosis not present

## 2016-08-11 DIAGNOSIS — D510 Vitamin B12 deficiency anemia due to intrinsic factor deficiency: Secondary | ICD-10-CM | POA: Diagnosis not present

## 2016-08-11 DIAGNOSIS — M1991 Primary osteoarthritis, unspecified site: Secondary | ICD-10-CM | POA: Diagnosis not present

## 2016-08-13 DIAGNOSIS — I619 Nontraumatic intracerebral hemorrhage, unspecified: Secondary | ICD-10-CM | POA: Diagnosis not present

## 2016-08-13 DIAGNOSIS — R42 Dizziness and giddiness: Secondary | ICD-10-CM | POA: Diagnosis not present

## 2016-08-13 DIAGNOSIS — F0390 Unspecified dementia without behavioral disturbance: Secondary | ICD-10-CM | POA: Diagnosis not present

## 2016-08-13 DIAGNOSIS — D510 Vitamin B12 deficiency anemia due to intrinsic factor deficiency: Secondary | ICD-10-CM | POA: Diagnosis not present

## 2016-08-13 DIAGNOSIS — R269 Unspecified abnormalities of gait and mobility: Secondary | ICD-10-CM | POA: Diagnosis not present

## 2016-08-13 DIAGNOSIS — I872 Venous insufficiency (chronic) (peripheral): Secondary | ICD-10-CM | POA: Diagnosis not present

## 2016-08-13 DIAGNOSIS — M545 Low back pain: Secondary | ICD-10-CM | POA: Diagnosis not present

## 2016-08-13 DIAGNOSIS — D692 Other nonthrombocytopenic purpura: Secondary | ICD-10-CM | POA: Diagnosis not present

## 2016-08-13 DIAGNOSIS — J449 Chronic obstructive pulmonary disease, unspecified: Secondary | ICD-10-CM | POA: Diagnosis not present

## 2016-08-15 DIAGNOSIS — J449 Chronic obstructive pulmonary disease, unspecified: Secondary | ICD-10-CM | POA: Diagnosis not present

## 2016-08-15 DIAGNOSIS — M6281 Muscle weakness (generalized): Secondary | ICD-10-CM | POA: Diagnosis not present

## 2016-08-15 DIAGNOSIS — M1991 Primary osteoarthritis, unspecified site: Secondary | ICD-10-CM | POA: Diagnosis not present

## 2016-08-15 DIAGNOSIS — L97811 Non-pressure chronic ulcer of other part of right lower leg limited to breakdown of skin: Secondary | ICD-10-CM | POA: Diagnosis not present

## 2016-08-15 DIAGNOSIS — D510 Vitamin B12 deficiency anemia due to intrinsic factor deficiency: Secondary | ICD-10-CM | POA: Diagnosis not present

## 2016-08-15 DIAGNOSIS — I872 Venous insufficiency (chronic) (peripheral): Secondary | ICD-10-CM | POA: Diagnosis not present

## 2016-08-15 DIAGNOSIS — G309 Alzheimer's disease, unspecified: Secondary | ICD-10-CM | POA: Diagnosis not present

## 2016-08-15 DIAGNOSIS — F0280 Dementia in other diseases classified elsewhere without behavioral disturbance: Secondary | ICD-10-CM | POA: Diagnosis not present

## 2016-08-15 DIAGNOSIS — I10 Essential (primary) hypertension: Secondary | ICD-10-CM | POA: Diagnosis not present

## 2016-08-20 DIAGNOSIS — J449 Chronic obstructive pulmonary disease, unspecified: Secondary | ICD-10-CM | POA: Diagnosis not present

## 2016-08-20 DIAGNOSIS — D510 Vitamin B12 deficiency anemia due to intrinsic factor deficiency: Secondary | ICD-10-CM | POA: Diagnosis not present

## 2016-08-20 DIAGNOSIS — M1991 Primary osteoarthritis, unspecified site: Secondary | ICD-10-CM | POA: Diagnosis not present

## 2016-08-20 DIAGNOSIS — L97811 Non-pressure chronic ulcer of other part of right lower leg limited to breakdown of skin: Secondary | ICD-10-CM | POA: Diagnosis not present

## 2016-08-20 DIAGNOSIS — F0280 Dementia in other diseases classified elsewhere without behavioral disturbance: Secondary | ICD-10-CM | POA: Diagnosis not present

## 2016-08-20 DIAGNOSIS — M6281 Muscle weakness (generalized): Secondary | ICD-10-CM | POA: Diagnosis not present

## 2016-08-20 DIAGNOSIS — G309 Alzheimer's disease, unspecified: Secondary | ICD-10-CM | POA: Diagnosis not present

## 2016-08-20 DIAGNOSIS — I10 Essential (primary) hypertension: Secondary | ICD-10-CM | POA: Diagnosis not present

## 2016-08-20 DIAGNOSIS — I872 Venous insufficiency (chronic) (peripheral): Secondary | ICD-10-CM | POA: Diagnosis not present

## 2016-08-27 DIAGNOSIS — M1991 Primary osteoarthritis, unspecified site: Secondary | ICD-10-CM | POA: Diagnosis not present

## 2016-08-27 DIAGNOSIS — M6281 Muscle weakness (generalized): Secondary | ICD-10-CM | POA: Diagnosis not present

## 2016-08-27 DIAGNOSIS — D510 Vitamin B12 deficiency anemia due to intrinsic factor deficiency: Secondary | ICD-10-CM | POA: Diagnosis not present

## 2016-08-27 DIAGNOSIS — L97811 Non-pressure chronic ulcer of other part of right lower leg limited to breakdown of skin: Secondary | ICD-10-CM | POA: Diagnosis not present

## 2016-08-27 DIAGNOSIS — J449 Chronic obstructive pulmonary disease, unspecified: Secondary | ICD-10-CM | POA: Diagnosis not present

## 2016-08-27 DIAGNOSIS — I10 Essential (primary) hypertension: Secondary | ICD-10-CM | POA: Diagnosis not present

## 2016-08-27 DIAGNOSIS — I872 Venous insufficiency (chronic) (peripheral): Secondary | ICD-10-CM | POA: Diagnosis not present

## 2016-08-27 DIAGNOSIS — F0280 Dementia in other diseases classified elsewhere without behavioral disturbance: Secondary | ICD-10-CM | POA: Diagnosis not present

## 2016-08-27 DIAGNOSIS — G309 Alzheimer's disease, unspecified: Secondary | ICD-10-CM | POA: Diagnosis not present

## 2016-08-28 DIAGNOSIS — I872 Venous insufficiency (chronic) (peripheral): Secondary | ICD-10-CM | POA: Diagnosis not present

## 2016-08-28 DIAGNOSIS — J449 Chronic obstructive pulmonary disease, unspecified: Secondary | ICD-10-CM | POA: Diagnosis not present

## 2016-08-28 DIAGNOSIS — D510 Vitamin B12 deficiency anemia due to intrinsic factor deficiency: Secondary | ICD-10-CM | POA: Diagnosis not present

## 2016-08-28 DIAGNOSIS — M1991 Primary osteoarthritis, unspecified site: Secondary | ICD-10-CM | POA: Diagnosis not present

## 2016-08-28 DIAGNOSIS — G309 Alzheimer's disease, unspecified: Secondary | ICD-10-CM | POA: Diagnosis not present

## 2016-08-28 DIAGNOSIS — L97811 Non-pressure chronic ulcer of other part of right lower leg limited to breakdown of skin: Secondary | ICD-10-CM | POA: Diagnosis not present

## 2016-08-28 DIAGNOSIS — I10 Essential (primary) hypertension: Secondary | ICD-10-CM | POA: Diagnosis not present

## 2016-08-28 DIAGNOSIS — M6281 Muscle weakness (generalized): Secondary | ICD-10-CM | POA: Diagnosis not present

## 2016-08-28 DIAGNOSIS — F0280 Dementia in other diseases classified elsewhere without behavioral disturbance: Secondary | ICD-10-CM | POA: Diagnosis not present

## 2016-08-29 DIAGNOSIS — G309 Alzheimer's disease, unspecified: Secondary | ICD-10-CM | POA: Diagnosis not present

## 2016-08-29 DIAGNOSIS — L97811 Non-pressure chronic ulcer of other part of right lower leg limited to breakdown of skin: Secondary | ICD-10-CM | POA: Diagnosis not present

## 2016-08-29 DIAGNOSIS — I872 Venous insufficiency (chronic) (peripheral): Secondary | ICD-10-CM | POA: Diagnosis not present

## 2016-08-29 DIAGNOSIS — D510 Vitamin B12 deficiency anemia due to intrinsic factor deficiency: Secondary | ICD-10-CM | POA: Diagnosis not present

## 2016-08-29 DIAGNOSIS — J449 Chronic obstructive pulmonary disease, unspecified: Secondary | ICD-10-CM | POA: Diagnosis not present

## 2016-08-29 DIAGNOSIS — M1991 Primary osteoarthritis, unspecified site: Secondary | ICD-10-CM | POA: Diagnosis not present

## 2016-08-29 DIAGNOSIS — M6281 Muscle weakness (generalized): Secondary | ICD-10-CM | POA: Diagnosis not present

## 2016-08-29 DIAGNOSIS — I10 Essential (primary) hypertension: Secondary | ICD-10-CM | POA: Diagnosis not present

## 2016-08-29 DIAGNOSIS — F0280 Dementia in other diseases classified elsewhere without behavioral disturbance: Secondary | ICD-10-CM | POA: Diagnosis not present

## 2016-09-02 DIAGNOSIS — D510 Vitamin B12 deficiency anemia due to intrinsic factor deficiency: Secondary | ICD-10-CM | POA: Diagnosis not present

## 2016-09-02 DIAGNOSIS — M1991 Primary osteoarthritis, unspecified site: Secondary | ICD-10-CM | POA: Diagnosis not present

## 2016-09-02 DIAGNOSIS — M6281 Muscle weakness (generalized): Secondary | ICD-10-CM | POA: Diagnosis not present

## 2016-09-02 DIAGNOSIS — I10 Essential (primary) hypertension: Secondary | ICD-10-CM | POA: Diagnosis not present

## 2016-09-02 DIAGNOSIS — I872 Venous insufficiency (chronic) (peripheral): Secondary | ICD-10-CM | POA: Diagnosis not present

## 2016-09-02 DIAGNOSIS — L97811 Non-pressure chronic ulcer of other part of right lower leg limited to breakdown of skin: Secondary | ICD-10-CM | POA: Diagnosis not present

## 2016-09-02 DIAGNOSIS — J449 Chronic obstructive pulmonary disease, unspecified: Secondary | ICD-10-CM | POA: Diagnosis not present

## 2016-09-02 DIAGNOSIS — G309 Alzheimer's disease, unspecified: Secondary | ICD-10-CM | POA: Diagnosis not present

## 2016-09-02 DIAGNOSIS — F0280 Dementia in other diseases classified elsewhere without behavioral disturbance: Secondary | ICD-10-CM | POA: Diagnosis not present

## 2016-09-03 DIAGNOSIS — D510 Vitamin B12 deficiency anemia due to intrinsic factor deficiency: Secondary | ICD-10-CM | POA: Diagnosis not present

## 2016-09-03 DIAGNOSIS — I10 Essential (primary) hypertension: Secondary | ICD-10-CM | POA: Diagnosis not present

## 2016-09-03 DIAGNOSIS — J449 Chronic obstructive pulmonary disease, unspecified: Secondary | ICD-10-CM | POA: Diagnosis not present

## 2016-09-03 DIAGNOSIS — M1991 Primary osteoarthritis, unspecified site: Secondary | ICD-10-CM | POA: Diagnosis not present

## 2016-09-03 DIAGNOSIS — I872 Venous insufficiency (chronic) (peripheral): Secondary | ICD-10-CM | POA: Diagnosis not present

## 2016-09-03 DIAGNOSIS — L97811 Non-pressure chronic ulcer of other part of right lower leg limited to breakdown of skin: Secondary | ICD-10-CM | POA: Diagnosis not present

## 2016-09-03 DIAGNOSIS — G309 Alzheimer's disease, unspecified: Secondary | ICD-10-CM | POA: Diagnosis not present

## 2016-09-03 DIAGNOSIS — M6281 Muscle weakness (generalized): Secondary | ICD-10-CM | POA: Diagnosis not present

## 2016-09-03 DIAGNOSIS — F0280 Dementia in other diseases classified elsewhere without behavioral disturbance: Secondary | ICD-10-CM | POA: Diagnosis not present

## 2016-09-06 ENCOUNTER — Inpatient Hospital Stay (HOSPITAL_COMMUNITY)
Admission: EM | Admit: 2016-09-06 | Discharge: 2016-09-11 | DRG: 193 | Disposition: A | Discharge status: Hospice | Payer: Medicare HMO | Attending: Internal Medicine | Admitting: Internal Medicine

## 2016-09-06 ENCOUNTER — Encounter (HOSPITAL_COMMUNITY): Payer: Self-pay | Admitting: Emergency Medicine

## 2016-09-06 ENCOUNTER — Emergency Department (HOSPITAL_COMMUNITY): Payer: Medicare HMO

## 2016-09-06 DIAGNOSIS — I10 Essential (primary) hypertension: Secondary | ICD-10-CM | POA: Diagnosis not present

## 2016-09-06 DIAGNOSIS — J869 Pyothorax without fistula: Secondary | ICD-10-CM | POA: Diagnosis present

## 2016-09-06 DIAGNOSIS — Z7189 Other specified counseling: Secondary | ICD-10-CM | POA: Diagnosis not present

## 2016-09-06 DIAGNOSIS — J13 Pneumonia due to Streptococcus pneumoniae: Secondary | ICD-10-CM | POA: Diagnosis not present

## 2016-09-06 DIAGNOSIS — F0391 Unspecified dementia with behavioral disturbance: Secondary | ICD-10-CM | POA: Diagnosis present

## 2016-09-06 DIAGNOSIS — J90 Pleural effusion, not elsewhere classified: Secondary | ICD-10-CM | POA: Diagnosis present

## 2016-09-06 DIAGNOSIS — I119 Hypertensive heart disease without heart failure: Secondary | ICD-10-CM | POA: Diagnosis present

## 2016-09-06 DIAGNOSIS — Z85118 Personal history of other malignant neoplasm of bronchus and lung: Secondary | ICD-10-CM

## 2016-09-06 DIAGNOSIS — Z79899 Other long term (current) drug therapy: Secondary | ICD-10-CM

## 2016-09-06 DIAGNOSIS — G9341 Metabolic encephalopathy: Secondary | ICD-10-CM | POA: Diagnosis present

## 2016-09-06 DIAGNOSIS — R0902 Hypoxemia: Secondary | ICD-10-CM

## 2016-09-06 DIAGNOSIS — J9601 Acute respiratory failure with hypoxia: Secondary | ICD-10-CM | POA: Diagnosis not present

## 2016-09-06 DIAGNOSIS — J189 Pneumonia, unspecified organism: Secondary | ICD-10-CM | POA: Diagnosis present

## 2016-09-06 DIAGNOSIS — J9811 Atelectasis: Secondary | ICD-10-CM | POA: Diagnosis present

## 2016-09-06 DIAGNOSIS — J918 Pleural effusion in other conditions classified elsewhere: Secondary | ICD-10-CM

## 2016-09-06 DIAGNOSIS — Z880 Allergy status to penicillin: Secondary | ICD-10-CM

## 2016-09-06 DIAGNOSIS — E876 Hypokalemia: Secondary | ICD-10-CM | POA: Diagnosis present

## 2016-09-06 DIAGNOSIS — J181 Lobar pneumonia, unspecified organism: Secondary | ICD-10-CM

## 2016-09-06 DIAGNOSIS — R091 Pleurisy: Secondary | ICD-10-CM | POA: Diagnosis not present

## 2016-09-06 DIAGNOSIS — Z66 Do not resuscitate: Secondary | ICD-10-CM | POA: Diagnosis present

## 2016-09-06 DIAGNOSIS — Z87891 Personal history of nicotine dependence: Secondary | ICD-10-CM

## 2016-09-06 DIAGNOSIS — N179 Acute kidney failure, unspecified: Secondary | ICD-10-CM | POA: Diagnosis present

## 2016-09-06 DIAGNOSIS — Z515 Encounter for palliative care: Secondary | ICD-10-CM | POA: Diagnosis not present

## 2016-09-06 DIAGNOSIS — R918 Other nonspecific abnormal finding of lung field: Secondary | ICD-10-CM | POA: Diagnosis not present

## 2016-09-06 DIAGNOSIS — R05 Cough: Secondary | ICD-10-CM | POA: Diagnosis not present

## 2016-09-06 DIAGNOSIS — F03918 Unspecified dementia, unspecified severity, with other behavioral disturbance: Secondary | ICD-10-CM

## 2016-09-06 DIAGNOSIS — F0151 Vascular dementia with behavioral disturbance: Secondary | ICD-10-CM | POA: Diagnosis not present

## 2016-09-06 HISTORY — DX: Essential (primary) hypertension: I10

## 2016-09-06 HISTORY — DX: Unspecified dementia, unspecified severity, without behavioral disturbance, psychotic disturbance, mood disturbance, and anxiety: F03.90

## 2016-09-06 LAB — CBC WITH DIFFERENTIAL/PLATELET
Basophils Absolute: 0 10*3/uL (ref 0.0–0.1)
Basophils Relative: 0 %
EOS ABS: 0 10*3/uL (ref 0.0–0.7)
EOS PCT: 0 %
HCT: 36.7 % — ABNORMAL LOW (ref 39.0–52.0)
Hemoglobin: 12.3 g/dL — ABNORMAL LOW (ref 13.0–17.0)
LYMPHS ABS: 1.2 10*3/uL (ref 0.7–4.0)
Lymphocytes Relative: 5 %
MCH: 32.5 pg (ref 26.0–34.0)
MCHC: 33.5 g/dL (ref 30.0–36.0)
MCV: 96.8 fL (ref 78.0–100.0)
MONOS PCT: 7 %
Monocytes Absolute: 1.7 10*3/uL — ABNORMAL HIGH (ref 0.1–1.0)
Neutro Abs: 19.7 10*3/uL — ABNORMAL HIGH (ref 1.7–7.7)
Neutrophils Relative %: 88 %
Platelets: 189 10*3/uL (ref 150–400)
RBC: 3.79 MIL/uL — ABNORMAL LOW (ref 4.22–5.81)
RDW: 13.9 % (ref 11.5–15.5)
WBC: 22.5 10*3/uL — AB (ref 4.0–10.5)

## 2016-09-06 LAB — URINALYSIS, ROUTINE W REFLEX MICROSCOPIC
Glucose, UA: NEGATIVE mg/dL
HGB URINE DIPSTICK: NEGATIVE
KETONES UR: 5 mg/dL — AB
LEUKOCYTES UA: NEGATIVE
Nitrite: NEGATIVE
PH: 5 (ref 5.0–8.0)
Protein, ur: 30 mg/dL — AB
SPECIFIC GRAVITY, URINE: 1.029 (ref 1.005–1.030)

## 2016-09-06 LAB — I-STAT CHEM 8, ED
BUN: 34 mg/dL — ABNORMAL HIGH (ref 6–20)
CALCIUM ION: 1.19 mmol/L (ref 1.15–1.40)
Chloride: 105 mmol/L (ref 101–111)
Creatinine, Ser: 1.1 mg/dL (ref 0.61–1.24)
Glucose, Bld: 119 mg/dL — ABNORMAL HIGH (ref 65–99)
HCT: 37 % — ABNORMAL LOW (ref 39.0–52.0)
HEMOGLOBIN: 12.6 g/dL — AB (ref 13.0–17.0)
Potassium: 3.3 mmol/L — ABNORMAL LOW (ref 3.5–5.1)
SODIUM: 143 mmol/L (ref 135–145)
TCO2: 25 mmol/L (ref 0–100)

## 2016-09-06 LAB — TSH: TSH: 2.408 u[IU]/mL (ref 0.350–4.500)

## 2016-09-06 MED ORDER — DOCUSATE SODIUM 100 MG PO CAPS
100.0000 mg | ORAL_CAPSULE | Freq: Two times a day (BID) | ORAL | Status: DC
  Administered 2016-09-06 – 2016-09-10 (×7): 100 mg via ORAL
  Filled 2016-09-06 (×9): qty 1

## 2016-09-06 MED ORDER — ONDANSETRON HCL 4 MG/2ML IJ SOLN: 4.0000 mg | Freq: Four times a day (QID) | INTRAMUSCULAR | Status: DC | PRN

## 2016-09-06 MED ORDER — DONEPEZIL HCL 10 MG PO TABS
5.0000 mg | ORAL_TABLET | Freq: Every day | ORAL | Status: DC
  Administered 2016-09-07 – 2016-09-10 (×4): 5 mg via ORAL
  Filled 2016-09-06 (×4): qty 1

## 2016-09-06 MED ORDER — IBUPROFEN 200 MG PO TABS: 400.0000 mg | ORAL_TABLET | Freq: Four times a day (QID) | ORAL | Status: DC | PRN

## 2016-09-06 MED ORDER — DEXTROSE 5 % IV SOLN
500.0000 mg | Freq: Once | INTRAVENOUS | Status: AC
  Administered 2016-09-06: 500 mg via INTRAVENOUS
  Filled 2016-09-06: qty 500

## 2016-09-06 MED ORDER — AMLODIPINE BESYLATE 5 MG PO TABS
5.0000 mg | ORAL_TABLET | Freq: Every day | ORAL | Status: DC
Start: 2016-09-07 — End: 2016-09-11
  Administered 2016-09-07 – 2016-09-10 (×4): 5 mg via ORAL
  Filled 2016-09-06 (×4): qty 1

## 2016-09-06 MED ORDER — DEXTROSE-NACL 5-0.9 % IV SOLN
INTRAVENOUS | Status: DC
  Administered 2016-09-06 – 2016-09-09 (×2): via INTRAVENOUS

## 2016-09-06 MED ORDER — DEXTROSE 5 % IV SOLN
1.0000 g | INTRAVENOUS | Status: DC
  Administered 2016-09-07 – 2016-09-10 (×4): 1 g via INTRAVENOUS
  Filled 2016-09-06 (×4): qty 10

## 2016-09-06 MED ORDER — DEXTROSE 5 % IV SOLN
500.0000 mg | INTRAVENOUS | Status: DC
  Administered 2016-09-07: 500 mg via INTRAVENOUS
  Filled 2016-09-06: qty 500

## 2016-09-06 MED ORDER — ACETAMINOPHEN 650 MG RE SUPP: 650.0000 mg | Freq: Four times a day (QID) | RECTAL | Status: DC | PRN

## 2016-09-06 MED ORDER — ENOXAPARIN SODIUM 30 MG/0.3ML ~~LOC~~ SOLN
30.0000 mg | SUBCUTANEOUS | Status: DC
  Administered 2016-09-06: 30 mg via SUBCUTANEOUS
  Filled 2016-09-06: qty 0.3

## 2016-09-06 MED ORDER — ACETAMINOPHEN 325 MG PO TABS: 650.0000 mg | ORAL_TABLET | Freq: Four times a day (QID) | ORAL | Status: DC | PRN

## 2016-09-06 MED ORDER — ONDANSETRON HCL 4 MG PO TABS: 4.0000 mg | ORAL_TABLET | Freq: Four times a day (QID) | ORAL | Status: DC | PRN

## 2016-09-06 MED ORDER — IPRATROPIUM-ALBUTEROL 0.5-2.5 (3) MG/3ML IN SOLN: 3.0000 mL | Freq: Four times a day (QID) | RESPIRATORY_TRACT | Status: DC | PRN

## 2016-09-06 MED ORDER — MEMANTINE HCL ER 28 MG PO CP24
28.0000 mg | ORAL_CAPSULE | Freq: Every day | ORAL | Status: DC
  Administered 2016-09-07 – 2016-09-10 (×4): 28 mg via ORAL
  Filled 2016-09-06 (×5): qty 1

## 2016-09-06 MED ORDER — POTASSIUM CHLORIDE CRYS ER 20 MEQ PO TBCR
40.0000 meq | EXTENDED_RELEASE_TABLET | Freq: Once | ORAL | Status: AC
  Administered 2016-09-06: 40 meq via ORAL
  Filled 2016-09-06: qty 2

## 2016-09-06 MED ORDER — DEXTROSE 5 % IV SOLN
1.0000 g | Freq: Once | INTRAVENOUS | Status: AC
  Administered 2016-09-06: 1 g via INTRAVENOUS
  Filled 2016-09-06: qty 10

## 2016-09-06 MED ORDER — SENNOSIDES-DOCUSATE SODIUM 8.6-50 MG PO TABS: 1.0000 | ORAL_TABLET | Freq: Every evening | ORAL | Status: DC | PRN

## 2016-09-06 NOTE — ED Notes (Signed)
Attempt to call report to floor. Primary nurse unavailable at present time; secretary verbalizes primary nurse will call back when available.

## 2016-09-06 NOTE — ED Notes (Signed)
Hospitalist at bedside. Will administer antibiotics post hospitalist assessment.

## 2016-09-06 NOTE — H&P (Signed)
History and Physical    [name]:[id number] DOB: Jul 25, 1921 DOA: 09/06/2016  PCP: Geoffery Lyons, MD   Patient coming from: Home   I have personally briefly reviewed patient's old medical records in Ronald Singh  Chief Complaint: SOB  HPI: Ronald Singh is a 83 y.o. male with medical history significant of HTN, dementia who presents complaining of SOB on exertion, productive cough , chills for last 3 days. He think he has had SOB for 2 weeks. She was also complaining of back pain.  He denies abdominal pain, nausea, or diarrhea. He vomited once day prior to admission. He denies cough while he eats.   ED Course: WBC at 22, k at 3.3, BUN 34, chest x ray: Bilateral multifocal air space process, left side pleural effusion.   Review of Systems: As per HPI otherwise 10 point review of systems negative.    Past Medical History:  Diagnosis Date  . Dementia   . Hypertension     Surgical history;  He think he had some type of lung sx.    reports that he has quit smoking. His smoking use included Cigarettes. He smoked 2.00 packs per day. He has never used smokeless tobacco. He reports that he does not drink alcohol or use drugs.  Not on File  Family History; father; alcoholic. Mother was healthy   Prior to Admission medications   Medication Sig Start Date End Date Taking? Authorizing Provider  amLODipine (NORVASC) 5 MG tablet Take 5 mg by mouth daily.   Yes Historical Provider, MD  donepezil (ARICEPT) 5 MG tablet Take 5 mg by mouth daily.   Yes Historical Provider, MD  MELATONIN PO Take 1 tablet by mouth at bedtime as needed (sleep).   Yes Historical Provider, MD  memantine (NAMENDA XR) 28 MG CP24 24 hr capsule Take 28 mg by mouth daily.   Yes Historical Provider, MD    Physical Exam: Vitals:   09/06/16 1441 09/06/16 1701  BP: (!) 152/119 135/64  Pulse: 71 73  Resp: 17 18  Temp: 98 F (36.7 C)   TempSrc: Oral   SpO2: 95% 91%    Constitutional: NAD,  calm, comfortable Vitals:   09/06/16 1441 09/06/16 1701  BP: (!) 152/119 135/64  Pulse: 71 73  Resp: 17 18  Temp: 98 F (36.7 C)   TempSrc: Oral   SpO2: 95% 91%   Eyes: PERRL, lids and conjunctivae normal ENMT: Mucous membranes are moist. Posterior pharynx clear of any exudate or lesions.Normal dentition.  Neck: normal, supple, no masses, no thyromegaly Respiratory:  Normal respiratory effort. No accessory muscle use. Bilateral fine crackles, no wheezing.  Cardiovascular: Regular rate and rhythm, no murmurs / rubs / gallops. No extremity edema. 2+ pedal pulses. No carotid bruits.  Abdomen: no tenderness, no masses palpated. No hepatosplenomegaly. Bowel sounds positive.  Musculoskeletal: no clubbing / cyanosis. No joint deformity upper and lower extremities. Good ROM, no contractures. Normal muscle tone.  Skin: no rashes, lesions, ulcers. No induration Neurologic: CN 2-12 grossly intact. Sensation intact, DTR normal. Strength 5/5 in all 4.  Psychiatric: Normal judgment and insight. Alert and oriented x 3. Normal mood.     Labs on Admission: I have personally reviewed following labs and imaging studies  CBC:  Recent Labs Lab 09/06/16 1554 09/06/16 1612  WBC 22.5*  --   NEUTROABS 19.7*  --   HGB 12.3* 12.6*  HCT 36.7* 37.0*  MCV 96.8  --   PLT 189  --  Basic Metabolic Panel:  Recent Labs Lab 09/06/16 1612  NA 143  K 3.3*  CL 105  GLUCOSE 119*  BUN 34*  CREATININE 1.10   GFR: CrCl cannot be calculated (Unknown ideal weight.). Liver Function Tests: No results for input(s): AST, ALT, ALKPHOS, BILITOT, PROT, ALBUMIN in the last 168 hours. No results for input(s): LIPASE, AMYLASE in the last 168 hours. No results for input(s): AMMONIA in the last 168 hours. Coagulation Profile: No results for input(s): INR, PROTIME in the last 168 hours. Cardiac Enzymes: No results for input(s): CKTOTAL, CKMB, CKMBINDEX, TROPONINI in the last 168 hours. BNP (last 3 results) No  results for input(s): PROBNP in the last 8760 hours. HbA1C: No results for input(s): HGBA1C in the last 72 hours. CBG: No results for input(s): GLUCAP in the last 168 hours. Lipid Profile: No results for input(s): CHOL, HDL, LDLCALC, TRIG, CHOLHDL, LDLDIRECT in the last 72 hours. Thyroid Function Tests: No results for input(s): TSH, T4TOTAL, FREET4, T3FREE, THYROIDAB in the last 72 hours. Anemia Panel: No results for input(s): VITAMINB12, FOLATE, FERRITIN, TIBC, IRON, RETICCTPCT in the last 72 hours. Urine analysis:    Component Value Date/Time   COLORURINE AMBER (A) 09/06/2016 1615   APPEARANCEUR CLEAR 09/06/2016 1615   LABSPEC 1.029 09/06/2016 1615   PHURINE 5.0 09/06/2016 1615   GLUCOSEU NEGATIVE 09/06/2016 1615   HGBUR NEGATIVE 09/06/2016 1615   BILIRUBINUR SMALL (A) 09/06/2016 1615   KETONESUR 5 (A) 09/06/2016 1615   PROTEINUR 30 (A) 09/06/2016 1615   NITRITE NEGATIVE 09/06/2016 1615   LEUKOCYTESUR NEGATIVE 09/06/2016 1615    Radiological Exams on Admission: Dg Chest 2 View  Result Date: 09/06/2016 CLINICAL DATA:  Productive cough a few days with low back pain. EXAM: CHEST  2 VIEW COMPARISON:  None. FINDINGS: Patient rotated to the left. Lungs are adequately inflated with patchy airspace opacification throughout the left lung worse over the lung base. Mild opacification over the right mid to lower lung. Findings likely due to infection with associated left pleural effusion. Two surgical clips over the right hilum. Cardiomediastinal silhouette is within normal. There is calcified plaque over the aortic arch. There are degenerative changes of the spine. IMPRESSION: Bilateral multifocal airspace process left worse than right likely a pneumonia. Associated left-sided pleural effusion. Aortic atherosclerosis. Electronically Signed   By: Marin Olp M.D.   On: 09/06/2016 16:19    EKG: none available.   Assessment/Plan Active Problems:   PNA (pneumonia)   1-PNA; multifocal.    Admit to med-surgery.  IV ceftriaxone and Azithromycin.  Sputum culture. Blood culture.  Speech evaluation.  Nebulizer prn.   2-Dementia;  He feels his memory is getting worse.  Check B 12, TSH.   3-HTN; continue with Norvasc.   DVT prophylaxis: Lovenox.  Code Status: Full code.  Family Communication: wife and friend  Disposition Plan: discharge home in 24 to 48 hours.  Consults called: none Admission status: observation, medical floor.    Elmarie Shiley MD [facility] Pager [phone]  If 7PM-7AM, please contact night-coverage [email or web address] Password [facility]  09/06/2016, 5:46 PM

## 2016-09-06 NOTE — ED Notes (Signed)
Pt significant other verbalizes pt having "extremely severe lower back pain last night and didn't even take a shower." Pt denies lower back pain and verbalizes "I just have the normal aches and pains I have for 10 years." Pt denies GU symptoms.

## 2016-09-06 NOTE — ED Triage Notes (Signed)
Patient here from home with complaints of lower back pain that started last night.

## 2016-09-06 NOTE — ED Notes (Signed)
Pt pulse oximetry with ambulation 91% on RA.

## 2016-09-06 NOTE — ED Provider Notes (Signed)
[facility] DEPT Provider Note   CSN: [id number] Arrival date & time: 09/06/16  1405     History   Chief Complaint Chief Complaint  Patient presents with  . Back Pain    HPI Ronald Singh is a 83 y.o. male.  HPI Patient is really without complaints. Reportedly brought in by family member concerned about back pain. Reportedly has had a cough sometimes. Reportedly had pain in his right flank area. Patient states he sometimes eats and gets pain in his epigastric area. States this is normal just aches and pains for him. Denies chest pain. Denies dysuria. Denies nausea vomiting. States his mind a shot. States he does have some dementia. He is able to tell me a lot about his life history. 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 States the woman with him just wants his money.   History reviewed. No pertinent past medical history.  There are no active problems to display for this patient.   History reviewed. No pertinent surgical history.     Home Medications    Prior to Admission medications   Medication Sig Start Date End Date Taking? Authorizing Provider  amLODipine (NORVASC) 5 MG tablet Take 5 mg by mouth daily.   Yes Historical Provider, MD  donepezil (ARICEPT) 5 MG tablet Take 5 mg by mouth daily.   Yes Historical Provider, MD  MELATONIN PO Take 1 tablet by mouth at bedtime as needed (sleep).   Yes Historical Provider, MD  memantine (NAMENDA XR) 28 MG CP24 24 hr capsule Take 28 mg by mouth daily.   Yes Historical Provider, MD    Family History History reviewed. No pertinent family history.  Social History Social History  Substance Use Topics  . Smoking status: Never Smoker  . Smokeless tobacco: Never Used  . Alcohol use Not on file     Allergies   Patient has no allergy information on record.   Review of Systems Review of Systems  Constitutional: Negative for appetite change.  HENT: Negative for  congestion.   Respiratory: Positive for cough. Negative for shortness of breath.   Gastrointestinal: Positive for abdominal pain.  Genitourinary: Positive for flank pain. Negative for dysuria.  Musculoskeletal: Negative for back pain.  Neurological: Negative for light-headedness.  Hematological: Negative for adenopathy.  Psychiatric/Behavioral: Negative for agitation.     Physical Exam Updated Vital Signs BP (!) 152/119 (BP Location: Left Arm)   Pulse 71   Temp 98 F (36.7 C) (Oral)   Resp 17   SpO2 95%   Physical Exam  Constitutional: He appears well-developed and well-nourished.  HENT:  Head: Atraumatic.  Neck: Neck supple.  Cardiovascular: Normal rate.   Pulmonary/Chest: Effort normal. He exhibits no tenderness.  Abdominal: There is no tenderness.  Musculoskeletal: He exhibits no edema or tenderness.  Neurological: He is alert.  Skin: Skin is warm. Capillary refill takes less than 2 seconds.     ED Treatments / Results  Labs (all labs ordered are listed, but only abnormal results are displayed) Labs Reviewed  URINALYSIS, ROUTINE W REFLEX MICROSCOPIC - Abnormal; Notable for the following:       Result Value   Color, Urine AMBER (*)    Bilirubin Urine SMALL (*)    Ketones, ur 5 (*)    Protein, ur 30 (*)    Bacteria, UA RARE (*)    Squamous Epithelial / LPF 0-5 (*)    All other components within normal  limits  CBC WITH DIFFERENTIAL/PLATELET - Abnormal; Notable for the following:    WBC 22.5 (*)    RBC 3.79 (*)    Hemoglobin 12.3 (*)    HCT 36.7 (*)    Neutro Abs 19.7 (*)    Monocytes Absolute 1.7 (*)    All other components within normal limits  I-STAT CHEM 8, ED - Abnormal; Notable for the following:    Potassium 3.3 (*)    BUN 34 (*)    Glucose, Bld 119 (*)    Hemoglobin 12.6 (*)    HCT 37.0 (*)    All other components within normal limits    EKG  EKG Interpretation None       Radiology Dg Chest 2 View  Result Date: 09/06/2016 CLINICAL DATA:   Productive cough a few days with low back pain. EXAM: CHEST  2 VIEW COMPARISON:  None. FINDINGS: Patient rotated to the left. Lungs are adequately inflated with patchy airspace opacification throughout the left lung worse over the lung base. Mild opacification over the right mid to lower lung. Findings likely due to infection with associated left pleural effusion. Two surgical clips over the right hilum. Cardiomediastinal silhouette is within normal. There is calcified plaque over the aortic arch. There are degenerative changes of the spine. IMPRESSION: Bilateral multifocal airspace process left worse than right likely a pneumonia. Associated left-sided pleural effusion. Aortic atherosclerosis. Electronically Signed   By: Marin Olp M.D.   On: 09/06/2016 16:19    Procedures Procedures (including critical care time)  Medications Ordered in ED Medications - No data to display   Initial Impression / Assessment and Plan / ED Course  I have reviewed the triage vital signs and the nursing notes.  Pertinent labs & imaging results that were available during my care of the patient were reviewed by me and considered in my medical decision making (see chart for details).     Patient with cough. Has some dementia. Patient is somewhat without complaints but maybe complaining of chest and back pain. X-ray shows bilateral multifocal pneumonia. Combined with his age and the dementia is worth admission.  Final Clinical Impressions(s) / ED Diagnoses   Final diagnoses:  Community acquired pneumonia, unspecified laterality    New Prescriptions New Prescriptions   No medications on file     Davonna Belling, MD 09/06/16 1654

## 2016-09-07 DIAGNOSIS — N179 Acute kidney failure, unspecified: Secondary | ICD-10-CM

## 2016-09-07 DIAGNOSIS — G9341 Metabolic encephalopathy: Secondary | ICD-10-CM | POA: Diagnosis present

## 2016-09-07 DIAGNOSIS — R0902 Hypoxemia: Secondary | ICD-10-CM | POA: Diagnosis not present

## 2016-09-07 DIAGNOSIS — F0391 Unspecified dementia with behavioral disturbance: Secondary | ICD-10-CM

## 2016-09-07 DIAGNOSIS — Z66 Do not resuscitate: Secondary | ICD-10-CM | POA: Diagnosis present

## 2016-09-07 DIAGNOSIS — F03918 Unspecified dementia, unspecified severity, with other behavioral disturbance: Secondary | ICD-10-CM

## 2016-09-07 DIAGNOSIS — J13 Pneumonia due to Streptococcus pneumoniae: Secondary | ICD-10-CM | POA: Diagnosis not present

## 2016-09-07 DIAGNOSIS — Z7189 Other specified counseling: Secondary | ICD-10-CM | POA: Diagnosis not present

## 2016-09-07 DIAGNOSIS — J9601 Acute respiratory failure with hypoxia: Secondary | ICD-10-CM | POA: Diagnosis not present

## 2016-09-07 DIAGNOSIS — J869 Pyothorax without fistula: Secondary | ICD-10-CM | POA: Diagnosis present

## 2016-09-07 DIAGNOSIS — J189 Pneumonia, unspecified organism: Secondary | ICD-10-CM | POA: Diagnosis present

## 2016-09-07 DIAGNOSIS — Z85118 Personal history of other malignant neoplasm of bronchus and lung: Secondary | ICD-10-CM | POA: Diagnosis not present

## 2016-09-07 DIAGNOSIS — I119 Hypertensive heart disease without heart failure: Secondary | ICD-10-CM | POA: Diagnosis present

## 2016-09-07 DIAGNOSIS — I10 Essential (primary) hypertension: Secondary | ICD-10-CM | POA: Diagnosis not present

## 2016-09-07 DIAGNOSIS — E876 Hypokalemia: Secondary | ICD-10-CM | POA: Diagnosis present

## 2016-09-07 DIAGNOSIS — Z79899 Other long term (current) drug therapy: Secondary | ICD-10-CM | POA: Diagnosis not present

## 2016-09-07 DIAGNOSIS — Z87891 Personal history of nicotine dependence: Secondary | ICD-10-CM | POA: Diagnosis not present

## 2016-09-07 DIAGNOSIS — J9811 Atelectasis: Secondary | ICD-10-CM | POA: Diagnosis present

## 2016-09-07 DIAGNOSIS — J181 Lobar pneumonia, unspecified organism: Secondary | ICD-10-CM

## 2016-09-07 DIAGNOSIS — Z880 Allergy status to penicillin: Secondary | ICD-10-CM | POA: Diagnosis not present

## 2016-09-07 DIAGNOSIS — F0151 Vascular dementia with behavioral disturbance: Secondary | ICD-10-CM | POA: Diagnosis not present

## 2016-09-07 DIAGNOSIS — Z515 Encounter for palliative care: Secondary | ICD-10-CM | POA: Diagnosis not present

## 2016-09-07 DIAGNOSIS — J90 Pleural effusion, not elsewhere classified: Secondary | ICD-10-CM | POA: Diagnosis present

## 2016-09-07 LAB — CBC
HEMATOCRIT: 36.6 % — AB (ref 39.0–52.0)
Hemoglobin: 12 g/dL — ABNORMAL LOW (ref 13.0–17.0)
MCH: 31.7 pg (ref 26.0–34.0)
MCHC: 32.8 g/dL (ref 30.0–36.0)
MCV: 96.8 fL (ref 78.0–100.0)
Platelets: 189 10*3/uL (ref 150–400)
RBC: 3.78 MIL/uL — AB (ref 4.22–5.81)
RDW: 13.9 % (ref 11.5–15.5)
WBC: 19.2 10*3/uL — AB (ref 4.0–10.5)

## 2016-09-07 LAB — BASIC METABOLIC PANEL
Anion gap: 8 (ref 5–15)
BUN: 29 mg/dL — AB (ref 6–20)
CO2: 25 mmol/L (ref 22–32)
CREATININE: 0.86 mg/dL (ref 0.61–1.24)
Calcium: 8.7 mg/dL — ABNORMAL LOW (ref 8.9–10.3)
Chloride: 109 mmol/L (ref 101–111)
GFR calc Af Amer: >60 mL/min (ref >60)
GFR calc non Af Amer: >60 mL/min (ref >60)
Glucose, Bld: 125 mg/dL — ABNORMAL HIGH (ref 65–99)
POTASSIUM: 3.7 mmol/L (ref 3.5–5.1)
SODIUM: 142 mmol/L (ref 135–145)

## 2016-09-07 LAB — STREP PNEUMONIAE URINARY ANTIGEN: Strep Pneumo Urinary Antigen: NEGATIVE

## 2016-09-07 LAB — VITAMIN B12: Vitamin B-12: 362 pg/mL (ref 180–914)

## 2016-09-07 MED ORDER — QUETIAPINE FUMARATE 50 MG PO TABS
50.0000 mg | ORAL_TABLET | Freq: Every day | ORAL | Status: DC
  Administered 2016-09-07: 50 mg via ORAL
  Filled 2016-09-07: qty 1

## 2016-09-07 MED ORDER — ENOXAPARIN SODIUM 40 MG/0.4ML ~~LOC~~ SOLN
40.0000 mg | SUBCUTANEOUS | Status: DC
  Administered 2016-09-07 – 2016-09-10 (×4): 40 mg via SUBCUTANEOUS
  Filled 2016-09-07 (×4): qty 0.4

## 2016-09-07 MED ORDER — LORAZEPAM 2 MG/ML IJ SOLN
1.0000 mg | Freq: Once | INTRAMUSCULAR | Status: AC
  Administered 2016-09-07: 1 mg via INTRAMUSCULAR
  Filled 2016-09-07: qty 1

## 2016-09-07 MED ORDER — HALOPERIDOL LACTATE 5 MG/ML IJ SOLN
2.0000 mg | Freq: Four times a day (QID) | INTRAMUSCULAR | Status: DC | PRN
Start: 2016-09-07 — End: 2016-09-10
  Administered 2016-09-07 – 2016-09-09 (×6): 2 mg via INTRAVENOUS
  Filled 2016-09-07 (×6): qty 1

## 2016-09-07 NOTE — Evaluation (Signed)
Clinical/Bedside Swallow Evaluation Patient Details  Name: MICKAL MENO MRN: [id number] Date of Birth: 1922-04-11  Today's Date: 09/07/2016 Time: SLP Start Time (ACUTE ONLY): 1420 SLP Stop Time (ACUTE ONLY): 1433 SLP Time Calculation (min) (ACUTE ONLY): 13 min  Past Medical History:  Past Medical History:  Diagnosis Date  . Dementia   . Hypertension    Past Surgical History: History reviewed. No pertinent surgical history. HPI:  83 year old male admitted with bilateral PNA (left greater than right). Patient with apparent episode of nausea nad vomiting prior to admission, increasing confusion (apparent cognitive decline over the last 6 months).    Assessment / Plan / Recommendation Clinical Impression  Patient presents with a normal oropharyngeal swallow with full appearing airway protection. No SLP f/u indicated at this time. If PNA becomes recurrent in nature, would consider instrumental testing.  SLP Visit Diagnosis: Dysphagia, unspecified (R13.10)    Aspiration Risk  Mild aspiration risk    Diet Recommendation Regular;Thin liquid   Liquid Administration via: Cup;Straw Medication Administration: Whole meds with liquid Supervision: Patient able to self feed;Intermittent supervision to cue for compensatory strategies Compensations: Slow rate;Small sips/bites Postural Changes: Seated upright at 90 degrees    Other  Recommendations Oral Care Recommendations: Oral care BID   Follow up Recommendations None           Swallow Study   General HPI: 83 year old male admitted with bilateral PNA (left greater than right). Patient with apparent episode of nausea nad vomiting prior to admission, increasing confusion (apparent cognitive decline over the last 6 months).  Type of Study: Bedside Swallow Evaluation Previous Swallow Assessment: none Diet Prior to this Study: Regular;Thin liquids Temperature Spikes Noted: No Respiratory Status: Room air History of Recent Intubation:  No Behavior/Cognition: Alert;Cooperative;Confused Oral Cavity Assessment: Within Functional Limits Oral Care Completed by SLP: Recent completion by staff Oral Cavity - Dentition: Adequate natural dentition Vision: Functional for self-feeding Self-Feeding Abilities: Able to feed self Patient Positioning: Upright in bed Baseline Vocal Quality: Normal Volitional Cough: Strong Volitional Swallow: Able to elicit    Oral/Motor/Sensory Function Overall Oral Motor/Sensory Function: Within functional limits   Ice Chips Ice chips: Not tested   Thin Liquid Thin Liquid: Within functional limits Presentation: Cup;Straw;Self Fed    Nectar Thick Nectar Thick Liquid: Not tested   Honey Thick Honey Thick Liquid: Not tested   Puree Puree: Within functional limits Presentation: Spoon   Solid   GO   Solid: Within functional limits    Functional Assessment Tool Used: skilled clinical judgement Functional Limitations: Swallowing Swallow Current Status (Y8144): 0 percent impaired, limited or restricted Swallow Goal Status (Y1856): 0 percent impaired, limited or restricted Swallow Discharge Status [phone]): 0 percent impaired, limited or restricted  Gabriel Rainwater MA, CCC-SLP [phone]  Rumaysa Sabatino Meryl 09/07/2016,2:42 PM

## 2016-09-07 NOTE — Care Management Obs Status (Signed)
[facility] NOTIFICATION   Patient Details  Name: Ronald Singh MRN: [id number] Date of Birth: April 03, 1922   Medicare Observation Status Notification Given:  Yes  Attempted contact to wife, [name] [phone]. Mailbox full. Unable to leave message.   Erenest Rasher, RN 09/07/2016, 6:07 PM

## 2016-09-07 NOTE — Progress Notes (Signed)
PROGRESS NOTE  Ronald Singh AUQ:[id number] DOB: 25-Oct-1921 DOA: 09/06/2016 PCP: Geoffery Lyons, MD  Brief History:  83 year old male with a history of dementia and hypertensionpresented with 3 days of dyspnea, cough, and subjective fevers and chills. Apparently, the patient had an episode of nausea and vomiting prior to admission to the hospital. Initially, his complaint was that of back pain, but family members report that the patient has been increasingly confused in recent days. They have noted that he has had progressive confusion and cognitive decline over the last 6 months, but has worsened in the past few days prior to admission. In the emergency department, the patient was noted to have WBC 22.5 with unremarkable BMP. Chest x-ray showed bilateral multiple focal infiltrates, left greater than right with small left pleural effusion. The patient was started on azithromycin and ceftriaxone. Throughout the hospitalization, the patient the patient had episodes of agitation requiring Haldol.  Assessment/Plan: Lobar pneumonia -Continue ceftriaxone and azithromycin -WBC slowly improving -CURB-65 score = 3 -follow blood cultures  Acute metabolic encephalopathy -secondary to infection process and AKI -B12--362 -TSH--2.408 -check RPR -family gives hx of wandering out of house at night and still driving and getting lost -seroquel at hs -discussed risks, benefits, alternatives with HPOA  AKI -serum creatinine improved with IVF 1.1-->0.8 -improved with IVF  Dementia -continue aricept and namenda  HTN -continue amlodipine   Disposition Plan:   Home in 1-2 days  Family Communication:  Spouse and HPOA updated at bedside--Total time spent 35 minutes.  Greater than 50% spent face to face counseling and coordinating care.   Consultants:  none  Code Status:  FULL   DVT Prophylaxis:  [place] Lovenox   Procedures: As Listed in Progress Note  Above  Antibiotics: Ceftriaxone 09/06/16>>> Azithromycin 09/06/16    Subjective: Patient has had increasing agitation during the hospitalization. Presently, he denies any fevers, chills, headache, neck pain, chest pain, short of breath. He has a nonproductive cough. No nausea, vomiting, diarrhea. No abdominal pain. He is tolerating his diet.  Objective: Vitals:   09/06/16 1851 09/06/16 1852 09/06/16 2052 09/07/16 0455  BP: 134/72  140/60 134/76  Pulse: 69  78 84  Resp: '18  18 18  '$ Temp:   98 F (36.7 C) 99 F (37.2 C)  TempSrc:   Oral Oral  SpO2: 95%  95% 98%  Weight:  60.6 kg (133 lb 9.6 oz)    Height:  '5\' 7"'$  (1.702 m)      Intake/Output Summary (Last 24 hours) at 09/07/16 1023 Last data filed at 09/07/16 0818  Gross per 24 hour  Intake             1010 ml  Output              850 ml  Net              160 ml   Weight change:  Exam:   General:  Pt is alert, follows commands appropriately, not in acute distress  HEENT: No icterus, No thrush, No neck mass, [place]/AT  Cardiovascular: RRR, S1/S2, no rubs, no gallops  Respiratory: Bibasilar crackles. No wheezing. Good air movement.  Abdomen: Soft/+BS, non tender, non distended, no guarding  Extremities: No edema, No lymphangitis, No petechiae, No rashes, no synovitis   Data Reviewed: I have personally reviewed following labs and imaging studies Basic Metabolic Panel:  Recent Labs Lab 09/06/16 1612 09/07/16 0411  NA 143 142  K 3.3* 3.7  CL 105 109  CO2  --  25  GLUCOSE 119* 125*  BUN 34* 29*  CREATININE 1.10 0.86  CALCIUM  --  8.7*   Liver Function Tests: No results for input(s): AST, ALT, ALKPHOS, BILITOT, PROT, ALBUMIN in the last 168 hours. No results for input(s): LIPASE, AMYLASE in the last 168 hours. No results for input(s): AMMONIA in the last 168 hours. Coagulation Profile: No results for input(s): INR, PROTIME in the last 168 hours. CBC:  Recent Labs Lab 09/06/16 1554 09/06/16 1612  09/07/16 0411  WBC 22.5*  --  19.2*  NEUTROABS 19.7*  --   --   HGB 12.3* 12.6* 12.0*  HCT 36.7* 37.0* 36.6*  MCV 96.8  --  96.8  PLT 189  --  189   Cardiac Enzymes: No results for input(s): CKTOTAL, CKMB, CKMBINDEX, TROPONINI in the last 168 hours. BNP: Invalid input(s): POCBNP CBG: No results for input(s): GLUCAP in the last 168 hours. HbA1C: No results for input(s): HGBA1C in the last 72 hours. Urine analysis:    Component Value Date/Time   COLORURINE AMBER (A) 09/06/2016 1615   APPEARANCEUR CLEAR 09/06/2016 1615   LABSPEC 1.029 09/06/2016 1615   PHURINE 5.0 09/06/2016 1615   GLUCOSEU NEGATIVE 09/06/2016 1615   HGBUR NEGATIVE 09/06/2016 1615   BILIRUBINUR SMALL (A) 09/06/2016 1615   KETONESUR 5 (A) 09/06/2016 1615   PROTEINUR 30 (A) 09/06/2016 1615   NITRITE NEGATIVE 09/06/2016 1615   LEUKOCYTESUR NEGATIVE 09/06/2016 1615   Sepsis Labs: '@LABRCNTIP'$ (procalcitonin:4,lacticidven:4) )No results found for this or any previous visit (from the past 240 hour(s)).   Scheduled Meds: . amLODipine  5 mg Oral Daily  . azithromycin  500 mg Intravenous Q24H  . cefTRIAXone (ROCEPHIN)  IV  1 g Intravenous Q24H  . docusate sodium  100 mg Oral BID  . donepezil  5 mg Oral Daily  . enoxaparin (LOVENOX) injection  30 mg Subcutaneous Q24H  . memantine  28 mg Oral Daily   Continuous Infusions: . dextrose 5 % and 0.9% NaCl 50 mL/hr at 09/06/16 1931    Procedures/Studies: Dg Chest 2 View  Result Date: 09/06/2016 CLINICAL DATA:  Productive cough a few days with low back pain. EXAM: CHEST  2 VIEW COMPARISON:  None. FINDINGS: Patient rotated to the left. Lungs are adequately inflated with patchy airspace opacification throughout the left lung worse over the lung base. Mild opacification over the right mid to lower lung. Findings likely due to infection with associated left pleural effusion. Two surgical clips over the right hilum. Cardiomediastinal silhouette is within normal. There is  calcified plaque over the aortic arch. There are degenerative changes of the spine. IMPRESSION: Bilateral multifocal airspace process left worse than right likely a pneumonia. Associated left-sided pleural effusion. Aortic atherosclerosis. Electronically Signed   By: Marin Olp M.D.   On: 09/06/2016 16:19    Cady Hafen, DO  [facility] Pager [phone]  If 7PM-7AM, please contact night-coverage [email or web address] Password [email or web address] 09/07/2016, 10:23 AM   LOS: 0 days

## 2016-09-08 LAB — BASIC METABOLIC PANEL
ANION GAP: 6 (ref 5–15)
BUN: 28 mg/dL — ABNORMAL HIGH (ref 6–20)
CALCIUM: 8.4 mg/dL — AB (ref 8.9–10.3)
CO2: 25 mmol/L (ref 22–32)
CREATININE: 0.76 mg/dL (ref 0.61–1.24)
Chloride: 109 mmol/L (ref 101–111)
GFR calc Af Amer: >60 mL/min (ref >60)
GFR calc non Af Amer: >60 mL/min (ref >60)
GLUCOSE: 110 mg/dL — AB (ref 65–99)
Potassium: 3.4 mmol/L — ABNORMAL LOW (ref 3.5–5.1)
Sodium: 140 mmol/L (ref 135–145)

## 2016-09-08 LAB — MAGNESIUM: MAGNESIUM: 2 mg/dL (ref 1.7–2.4)

## 2016-09-08 LAB — CBC
HCT: 32.3 % — ABNORMAL LOW (ref 39.0–52.0)
Hemoglobin: 10.9 g/dL — ABNORMAL LOW (ref 13.0–17.0)
MCH: 32.7 pg (ref 26.0–34.0)
MCHC: 33.7 g/dL (ref 30.0–36.0)
MCV: 97 fL (ref 78.0–100.0)
PLATELETS: 188 10*3/uL (ref 150–400)
RBC: 3.33 MIL/uL — ABNORMAL LOW (ref 4.22–5.81)
RDW: 13.9 % (ref 11.5–15.5)
WBC: 13.1 10*3/uL — ABNORMAL HIGH (ref 4.0–10.5)

## 2016-09-08 MED ORDER — IPRATROPIUM-ALBUTEROL 0.5-2.5 (3) MG/3ML IN SOLN
3.0000 mL | Freq: Three times a day (TID) | RESPIRATORY_TRACT | Status: DC
  Administered 2016-09-08 – 2016-09-10 (×7): 3 mL via RESPIRATORY_TRACT
  Filled 2016-09-08 (×7): qty 3

## 2016-09-08 MED ORDER — QUETIAPINE FUMARATE 25 MG PO TABS: 75.0000 mg | ORAL_TABLET | Freq: Every day | ORAL | 1 refills | Status: DC

## 2016-09-08 MED ORDER — IPRATROPIUM-ALBUTEROL 0.5-2.5 (3) MG/3ML IN SOLN: 3.0000 mL | Freq: Three times a day (TID) | RESPIRATORY_TRACT | Status: DC

## 2016-09-08 MED ORDER — CEFDINIR 300 MG PO CAPS: 300.0000 mg | ORAL_CAPSULE | Freq: Two times a day (BID) | ORAL | 0 refills | Status: DC

## 2016-09-08 MED ORDER — POTASSIUM CHLORIDE CRYS ER 20 MEQ PO TBCR
20.0000 meq | EXTENDED_RELEASE_TABLET | Freq: Once | ORAL | Status: AC
  Administered 2016-09-08: 20 meq via ORAL
  Filled 2016-09-08: qty 1

## 2016-09-08 MED ORDER — QUETIAPINE FUMARATE 50 MG PO TABS
75.0000 mg | ORAL_TABLET | Freq: Every day | ORAL | Status: DC
  Administered 2016-09-08 – 2016-09-10 (×3): 75 mg via ORAL
  Filled 2016-09-08 (×3): qty 2

## 2016-09-08 MED ORDER — AZITHROMYCIN 250 MG PO TABS
500.0000 mg | ORAL_TABLET | Freq: Every day | ORAL | Status: DC
  Administered 2016-09-08 – 2016-09-10 (×3): 500 mg via ORAL
  Filled 2016-09-08 (×4): qty 2

## 2016-09-08 MED ORDER — AZITHROMYCIN 500 MG PO TABS: 500.0000 mg | ORAL_TABLET | Freq: Every day | ORAL | 0 refills | Status: DC

## 2016-09-08 NOTE — Progress Notes (Signed)
PHARMACIST - PHYSICIAN COMMUNICATION DR:   Tat CONCERNING: Antibiotic IV to Oral Route Change Policy  RECOMMENDATION: This patient is receiving Azithromycin by the intravenous route.  Based on criteria approved by the Pharmacy and Therapeutics Committee, the antibiotic(s) is/are being converted to the equivalent oral dose form(s).   DESCRIPTION: These criteria include:  Patient being treated for a respiratory tract infection, urinary tract infection, cellulitis or clostridium difficile associated diarrhea if on metronidazole  The patient is not neutropenic and does not exhibit a GI malabsorption state  The patient is eating (either orally or via tube) and/or has been taking other orally administered medications for a least 24 hours  The patient is improving clinically and has a Tmax < 100.5  If you have questions about this conversion, please contact the Pharmacy Department  '[]'$   [phone] )  [facility] '[]'$   [phone] )  [facility] '[]'$   [phone] )  [facility] '[]'$   [phone] )  [facility] '[]'$   [phone] )  [facility]

## 2016-09-08 NOTE — Progress Notes (Signed)
This CM was contacted by [facility] home health services rep. She states that pt is active with them for HHPT/RN. Will need resumption orders at DC if pt is to continue home health services at discharge. CM will continue to follow. Marney Doctor RN,BSN,NCM [phone]

## 2016-09-08 NOTE — Progress Notes (Addendum)
PROGRESS NOTE  Ronald Singh [id number]:[id number] DOB: 11/18/1921 DOA: 09/06/2016 PCP: Geoffery Lyons, MD  Brief History:  83 year old male with a history of dementia and hypertensionpresented with 3 days of dyspnea, cough, and subjective fevers and chills. Apparently, the patient had an episode of nausea and vomiting prior to admission to the hospital. Initially, his complaint was that of back pain, but family members report that the patient has been increasingly confused in recent days. They have noted that he has had progressive confusion and cognitive decline over the last 6 months, but has worsened in the past few days prior to admission. In the emergency department, the patient was noted to have WBC 22.5 with unremarkable BMP. Chest x-ray showed bilateral multiple focal infiltrates, left greater than right with small left pleural effusion. The patient was started on azithromycin and ceftriaxone. Throughout the hospitalization, the patient the patient had episodes of agitation requiring Haldol.  Assessment/Plan: Lobar pneumonia -Continue ceftriaxone and azithromycin -WBC slowly improving -CURB-65 score = 3 -follow blood cultures--neg to date -duonebs q 8 hours  Acute metabolic encephalopathy -secondary to infection process and AKI -B12--362 -TSH--2.408 -check RPR -family gives hx of wandering out of house at night and still driving and getting lost -seroquel 75 mg at hs -discussed risks, benefits, alternatives with HPOA who agrees with seroquel -speech therapy eval-->regular diet with thin liquid  AKI -serum creatinine improved with IVF 1.1-->0.76 -improved with IVF  Dementia -continue aricept and namenda  HTN -continue amlodipine -BP acceptable  Hypokalemia -replete -mag 2.0   Disposition Plan:   Home 3/20 or 3/21 Family Communication: caretaker at bedside   Consultants:  none  Code Status:  FULL   DVT Prophylaxis:  [place]  Lovenox   Procedures: As Listed in Progress Note Above  Antibiotics: Ceftriaxone 09/06/16>>> Azithromycin 09/06/16>>>   Subjective: Patient denies fevers, chills, headache, chest pain, dyspnea, nausea, vomiting, diarrhea, abdominal pain, dysuria, hematuria, hematochezia, and melena.   Objective: Vitals:   09/07/16 1345 09/07/16 1900 09/08/16 0626 09/08/16 1449  BP: (!) 145/52 137/74 (!) 148/72 (!) 153/54  Pulse: 97 (!) 112 81 80  Resp: 18 (!) '22 20 20  '$ Temp: 97.9 F (36.6 C) 98.8 F (37.1 C) 98 F (36.7 C) 98 F (36.7 C)  TempSrc: Oral Oral Axillary Axillary  SpO2: 95% 97% 91% 98%  Weight:      Height:       No intake or output data in the 24 hours ending 09/08/16 1735 Weight change:  Exam:   General:  Pt is alert, follows commands appropriately, not in acute distress  HEENT: No icterus, No thrush, No neck mass, [place]/AT  Cardiovascular: RRR, S1/S2, no rubs, no gallops  Respiratory: bilateral scattered rhonchi  Abdomen: Soft/+BS, non tender, non distended, no guarding  Extremities: trace LE edema, No lymphangitis, No petechiae, No rashes, no synovitis   Data Reviewed: I have personally reviewed following labs and imaging studies Basic Metabolic Panel:  Recent Labs Lab 09/06/16 1612 09/07/16 0411 09/08/16 0843  NA 143 142 140  K 3.3* 3.7 3.4*  CL 105 109 109  CO2  --  25 25  GLUCOSE 119* 125* 110*  BUN 34* 29* 28*  CREATININE 1.10 0.86 0.76  CALCIUM  --  8.7* 8.4*  MG  --   --  2.0   Liver Function Tests: No results for input(s): AST, ALT, ALKPHOS, BILITOT, PROT, ALBUMIN in the last 168 hours. No results for input(s): LIPASE, AMYLASE  in the last 168 hours. No results for input(s): AMMONIA in the last 168 hours. Coagulation Profile: No results for input(s): INR, PROTIME in the last 168 hours. CBC:  Recent Labs Lab 09/06/16 1554 09/06/16 1612 09/07/16 0411 09/08/16 0843  WBC 22.5*  --  19.2* 13.1*  NEUTROABS 19.7*  --   --   --   HGB  12.3* 12.6* 12.0* 10.9*  HCT 36.7* 37.0* 36.6* 32.3*  MCV 96.8  --  96.8 97.0  PLT 189  --  189 188   Cardiac Enzymes: No results for input(s): CKTOTAL, CKMB, CKMBINDEX, TROPONINI in the last 168 hours. BNP: Invalid input(s): POCBNP CBG: No results for input(s): GLUCAP in the last 168 hours. HbA1C: No results for input(s): HGBA1C in the last 72 hours. Urine analysis:    Component Value Date/Time   COLORURINE AMBER (A) 09/06/2016 1615   APPEARANCEUR CLEAR 09/06/2016 1615   LABSPEC 1.029 09/06/2016 1615   PHURINE 5.0 09/06/2016 1615   GLUCOSEU NEGATIVE 09/06/2016 1615   HGBUR NEGATIVE 09/06/2016 1615   BILIRUBINUR SMALL (A) 09/06/2016 1615   KETONESUR 5 (A) 09/06/2016 1615   PROTEINUR 30 (A) 09/06/2016 1615   NITRITE NEGATIVE 09/06/2016 1615   LEUKOCYTESUR NEGATIVE 09/06/2016 1615   Sepsis Labs: '@LABRCNTIP'$ (procalcitonin:4,lacticidven:4) ) Recent Results (from the past 240 hour(s))  Culture, blood (routine x 2) Call MD if unable to obtain prior to antibiotics being given     Status: None (Preliminary result)   Collection Time: 09/06/16  7:25 PM  Result Value Ref Range Status   Specimen Description BLOOD LEFT ANTECUBITAL  Final   Special Requests BOTTLES DRAWN AEROBIC ONLY 5ML  Final   Culture   Final    NO GROWTH 1 DAY Performed at [facility], [place] [address] [place], [place] [address]    Report Status PENDING  Incomplete  Culture, blood (routine x 2) Call MD if unable to obtain prior to antibiotics being given     Status: None (Preliminary result)   Collection Time: 09/06/16  7:25 PM  Result Value Ref Range Status   Specimen Description BLOOD LEFT HAND  Final   Special Requests IN PEDIATRIC BOTTLE 4ML  Final   Culture   Final    NO GROWTH 1 DAY Performed at [facility], [place] [address] [place], [place] [address]    Report Status PENDING  Incomplete     Scheduled Meds: . amLODipine  5 mg Oral Daily  . azithromycin  500 mg Oral Daily  . cefTRIAXone  (ROCEPHIN)  IV  1 g Intravenous Q24H  . docusate sodium  100 mg Oral BID  . donepezil  5 mg Oral Daily  . enoxaparin (LOVENOX) injection  40 mg Subcutaneous Q24H  . ipratropium-albuterol  3 mL Nebulization TID  . memantine  28 mg Oral Daily  . QUEtiapine  50 mg Oral QHS   Continuous Infusions: . dextrose 5 % and 0.9% NaCl 50 mL/hr at 09/06/16 1931    Procedures/Studies: Dg Chest 2 View  Result Date: 09/06/2016 CLINICAL DATA:  Productive cough a few days with low back pain. EXAM: CHEST  2 VIEW COMPARISON:  None. FINDINGS: Patient rotated to the left. Lungs are adequately inflated with patchy airspace opacification throughout the left lung worse over the lung base. Mild opacification over the right mid to lower lung. Findings likely due to infection with associated left pleural effusion. Two surgical clips over the right hilum. Cardiomediastinal silhouette is within normal. There is calcified plaque over  the aortic arch. There are degenerative changes of the spine. IMPRESSION: Bilateral multifocal airspace process left worse than right likely a pneumonia. Associated left-sided pleural effusion. Aortic atherosclerosis. Electronically Signed   By: Marin Olp M.D.   On: 09/06/2016 16:19    Oscar Hank, DO  [facility] Pager [phone]  If 7PM-7AM, please contact night-coverage [email or web address] Password [email or web address] 09/08/2016, 5:35 PM   LOS: 1 day

## 2016-09-09 ENCOUNTER — Inpatient Hospital Stay (HOSPITAL_COMMUNITY): Payer: Medicare HMO

## 2016-09-09 DIAGNOSIS — J90 Pleural effusion, not elsewhere classified: Secondary | ICD-10-CM

## 2016-09-09 DIAGNOSIS — J9601 Acute respiratory failure with hypoxia: Secondary | ICD-10-CM | POA: Diagnosis present

## 2016-09-09 LAB — AMMONIA: Ammonia: 20 umol/L (ref 9–35)

## 2016-09-09 LAB — BASIC METABOLIC PANEL
Anion gap: 8 (ref 5–15)
BUN: 25 mg/dL — AB (ref 6–20)
CO2: 26 mmol/L (ref 22–32)
Calcium: 8.4 mg/dL — ABNORMAL LOW (ref 8.9–10.3)
Chloride: 109 mmol/L (ref 101–111)
Creatinine, Ser: 0.83 mg/dL (ref 0.61–1.24)
GFR calc Af Amer: >60 mL/min (ref >60)
GLUCOSE: 121 mg/dL — AB (ref 65–99)
Potassium: 3.6 mmol/L (ref 3.5–5.1)
Sodium: 143 mmol/L (ref 135–145)

## 2016-09-09 LAB — CBC
HEMATOCRIT: 33.9 % — AB (ref 39.0–52.0)
HEMOGLOBIN: 11.3 g/dL — AB (ref 13.0–17.0)
MCH: 31.3 pg (ref 26.0–34.0)
MCHC: 33.3 g/dL (ref 30.0–36.0)
MCV: 93.9 fL (ref 78.0–100.0)
Platelets: 223 10*3/uL (ref 150–400)
RBC: 3.61 MIL/uL — AB (ref 4.22–5.81)
RDW: 13.6 % (ref 11.5–15.5)
WBC: 9.9 10*3/uL (ref 4.0–10.5)

## 2016-09-09 LAB — RPR: RPR Ser Ql: NONREACTIVE

## 2016-09-09 LAB — HIV ANTIBODY (ROUTINE TESTING W REFLEX): HIV Screen 4th Generation wRfx: NONREACTIVE

## 2016-09-09 LAB — CK: Total CK: 179 U/L (ref 49–397)

## 2016-09-09 NOTE — Evaluation (Signed)
Physical Therapy Evaluation Patient Details Name: Ronald Singh MRN: [id number] DOB: 1921-10-25 Today's Date: 09/09/2016   History of Present Illness  Ronald Singh is a 83 y.o. male with medical history significant of HTN, dementia who presents complaining of SOB on exertion, productive cough , chills for last 3 days. He think he has had SOB for 2 weeks. He was also complaining of back pain    Clinical Impression  Pt admitted with above diagnosis. Pt currently with functional limitations due to the deficits listed below (see PT Problem List). Pt required MAX A of 2 for bed mobility and standing today.  Per nursing, pt ambulated with nurse tech over the weekend. Family is very concerned about change in functional status and is hoping it is medication related.  Pt with congested cough (o2 91% on RA), but unable to perform SPT and was very fatigued from sit > stand.  At this time, recommend St[facility] to get pt to chair for OOB. Pt will benefit from skilled PT to increase their independence and safety with mobility to allow discharge to the venue listed below.  Recommend SNF for short term rehab at time of d/c.     Follow Up Recommendations Supervision/Assistance - 24 hour;SNF    Equipment Recommendations  None recommended by PT    Recommendations for Other Services       Precautions / Restrictions Precautions Precautions: Fall Restrictions Weight Bearing Restrictions: No      Mobility  Bed Mobility Overal bed mobility: Needs Assistance Bed Mobility: Supine to Sit;Sit to Supine     Supine to sit: Max assist;+2 for physical assistance Sit to supine: Max assist;+2 for physical assistance   General bed mobility comments: Pt required cueing for hand placement and needed A for to move legs and for getting trunk upright  Transfers Overall transfer level: Needs assistance Equipment used: Rolling walker (2 wheeled) Transfers: Sit to/from Stand Sit to Stand: +2 physical  assistance;Mod assist;Max assist         General transfer comment: MOD A of 2 to stand, but then MAX of 2 to maintain standing. Pt unable to take any steps towards chair.  Did take a few small steps forward and then had to sit for safety.  Ambulation/Gait             General Gait Details: unable  Stairs            Wheelchair Mobility    Modified Rankin (Stroke Patients Only)       Balance Overall balance assessment: History of Falls;Needs assistance   Sitting balance-Leahy Scale: Poor Sitting balance - Comments: Fair > poor     Standing balance-Leahy Scale: Zero Standing balance comment: required RW and MAX support                             Pertinent Vitals/Pain Pain Assessment: Faces Faces Pain Scale: Hurts little more Pain Location: with bed mobility, unable to specify where Pain Descriptors / Indicators: Grimacing Pain Intervention(s): Limited activity within patient's tolerance;Monitored during session    Home Living Family/patient expects to be discharged to:: Private residence Living Arrangements: Spouse/significant other Available Help at Discharge: Family;Available 24 hours/day Type of Home: House Home Access: Stairs to enter Entrance Stairs-Rails: Right;Left;Can reach both Entrance Stairs-Number of Steps: 3 Home Layout: One level Home Equipment: Cane - single point;Walker - 4 wheels      Prior Function Level of Independence: Independent with  assistive device(s)         Comments: Amb with rollator or canes      Hand Dominance        Extremity/Trunk Assessment   Upper Extremity Assessment Upper Extremity Assessment: Generalized weakness    Lower Extremity Assessment Lower Extremity Assessment: Generalized weakness       Communication   Communication: Other (comment) (garbeled speech)  Cognition Arousal/Alertness: Lethargic (improved as session progressed) Behavior During Therapy: Anxious Overall Cognitive  Status: History of cognitive impairments - at baseline Area of Impairment: Orientation;Following commands;Safety/judgement;Awareness;Problem solving Orientation Level: Disoriented to;Place;Time;Situation     Following Commands: Follows one step commands inconsistently Safety/Judgement: Decreased awareness of safety;Decreased awareness of deficits Awareness: Intellectual Problem Solving: Difficulty sequencing;Requires tactile cues General Comments: Pt with hx of dementia, but appears more confused than normal per family    General Comments General comments (skin integrity, edema, etc.): pt with hand mittens on upon arrival.  Removed and kept off as pt not pulling on lines and family present.  Nursing aware. o2 91% on RA    Exercises     Assessment/Plan    PT Assessment Patient needs continued PT services  PT Problem List Decreased strength;Decreased activity tolerance;Decreased balance;Decreased mobility;Decreased coordination;Decreased cognition;Decreased safety awareness       PT Treatment Interventions DME instruction;Gait training;Functional mobility training;Therapeutic activities;Therapeutic exercise;Patient/family education;Balance training    PT Goals (Current goals can be found in the Care Plan section)  Acute Rehab PT Goals Patient Stated Goal: to get back to walking PT Goal Formulation: With family Time For Goal Achievement: 09/23/16 Potential to Achieve Goals: Fair    Frequency Min 3X/week   Barriers to discharge        Co-evaluation               End of Session Equipment Utilized During Treatment: Gait belt Activity Tolerance: Patient limited by fatigue Patient left: in bed;with call bell/phone within reach;with bed alarm set;with family/visitor present Nurse Communication: Mobility status PT Visit Diagnosis: Unsteadiness on feet (R26.81);Muscle weakness (generalized) (M62.81)         Time: 4580-9983 PT Time Calculation (min) (ACUTE ONLY): 38  min   Charges:   PT Evaluation $PT Eval Moderate Complexity: 1 Procedure PT Treatments $Therapeutic Activity: 23-37 mins   PT G Codes:         Sweetie Giebler LUBECK 09/09/2016, 10:35 AM

## 2016-09-09 NOTE — Clinical Social Work Note (Addendum)
Clinical Social Work Assessment  Patient Details  Name: Ronald Singh MRN: [id number] Date of Birth: Dec 13, 1921  Date of referral:  09/09/16               Reason for consult:  Facility Placement, Family Concerns, Discharge Planning                Permission sought to share information with:  Case Manager, Customer service manager, Family Supports Permission granted to share information::  Yes, Verbal Permission Granted  Name::        Agency::     Relationship::  Wife and Youth worker Information:     Housing/Transportation Living arrangements for the past 2 months:  Single Family Home Source of Information:  Medical Team, Case Manager, Spouse Patient Interpreter Needed:  None Criminal Activity/Legal Involvement Pertinent to Current Situation/Hospitalization:  No - Comment as needed Significant Relationships:  Adult Children, Other Family Members, Spouse Lives with:  Spouse Do you feel safe going back to the place where you live?  No Need for family participation in patient care:  Yes (Comment)  Care giving concerns:  Call placed to patient's wife who was not in room with patient and patient unable to participate. Patient currently in mitts and awake in room, but cannot answer questions. Wife reports her niece makes all the decisions and message left for niece. Wife reports she is unable to care for patient at his current baseline.  Reports patient has been very verbally aggressive/abusive towards her throughout the years and although she knows this is not right she wants to do the right thing and loves him so she has remained and will continue to help him.  Wife reports niece will need to be involved with decisions.  Message left for niece.  LCSW had long conversation with Ronald Singh POA who reports she is dual for financial and medical.  Reports prior to admission patient was a very wealthy man, hard worker, and enjoyed control.  Reports he did have some home health coming in  to manage a wound and PT, but overall patient was still driving (even though he was not suppose to be) and independent.  Ronald Singh feels patient is far from baseline due to his IM of Haldol and sensitivity.  She reports if medication can be stopped and see if he recovers he may be able to get out of mitts and engage.  PCP contacted regarding baseline and known Dementia.  It appears patient is decompensating while in hospital and dementia is progressing.  Ronald Singh agrees as stated above.  Plan at this time will be to seek SNF and begin insurance authorization. LCSW has started process. Will ask for PT to come back and see him and recommendations by PCP to discontinue Haldol and increase Seroquel.   LCSW will follow up with offers as well as insurance. Patient must be in gerichair/sitter free for 24 hours prior to admission to SNF.  Will follow up with POA.  Social Worker assessment / plan:  Pending full assessment.  It is clear patient is unable to return home at discharge due to level of care, however barriers are his insurance as patient is being recommended more of supervision/custodial care vs a rehab short term placement.  Insurance does not pay for this benefit and patient would most likely have to pay out of pocket however, LCSW will complete referral and see if there is an insurance benefit that can accomodates placement before paying out of pocket.   Employment  status:  Retired Nurse, adult PT Recommendations:  [place] / Referral to community resources:  [place]  Patient/Family's Response to care:  pending  Patient/Family's Understanding of and Emotional Response to Diagnosis, Current Treatment, and Prognosis:  pending  Emotional Assessment Appearance:  Appears stated age Attitude/Demeanor/Rapport:  Reactive Affect (typically observed):  Unable to Assess Orientation:  Oriented to Self Alcohol / Substance use:  Not  Applicable Psych involvement (Current and /or in the community):  No (Comment)  Discharge Needs  Concerns to be addressed:  Coping/Stress Concerns, Home Safety Concerns Readmission within the last 30 days:  No Current discharge risk:  None Barriers to Discharge:  Ship broker, Continued Medical Work up   Lilly Cove, LCSW 09/09/2016, 3:15 PM

## 2016-09-09 NOTE — Progress Notes (Signed)
MD aware of CXR results.

## 2016-09-09 NOTE — Clinical Social Work Placement (Addendum)
[facility] number has been obtained:  [id number]    CLINICAL SOCIAL WORK PLACEMENT  NOTE  Date:  09/09/2016  Patient Details  Name: Ronald Singh MRN: [id number] Date of Birth: November 28, 1921  Clinical Social Work is seeking post-discharge placement for this patient at the [place] level of care (*CSW will initial, date and re-position this form in  chart as items are completed):  Yes   Patient/family provided with [facility] Work Department's list of facilities offering this level of care within the geographic area requested by the patient (or if unable, by the patient's family).  Yes   Patient/family informed of their freedom to choose among providers that offer the needed level of care, that participate in Medicare, Medicaid or managed care program needed by the patient, have an available bed and are willing to accept the patient.  Yes   Patient/family informed of [facility]'s ownership interest in [facility] and [facility], as well as of the fact that they are under no obligation to receive care at these facilities.  PASRR submitted to EDS on 09/09/16     PASRR number received on     09/10/2016   Existing PASRR number confirmed on       FL2 transmitted to all facilities in geographic area requested by pt/family on 09/09/16     FL2 transmitted to all facilities within larger geographic area on 09/09/16     Patient informed that his/her managed care company has contracts with or will negotiate with certain facilities, including the following:            Patient/family informed of bed offers received.  Patient chooses bed at       Physician recommends and patient chooses bed at      Patient to be transferred to   on  .  Patient to be transferred to facility by       Patient family notified on   of transfer.  Name of family member notified:        PHYSICIAN Please sign FL2     Additional Comment:     _______________________________________________ Lilly Cove, LCSW 09/09/2016, 4:16 PM

## 2016-09-09 NOTE — Progress Notes (Addendum)
PROGRESS NOTE  Ronald Singh YOV:[id number] DOB: 07/24/1921 DOA: 09/06/2016 PCP: Geoffery Lyons, MD  Brief History: 83 year old male with a history of dementia and hypertensionpresented with 3 daysof dyspnea, cough, and subjective fevers and chills. Apparently, the patient had an episode of nausea and vomiting prior to admission to the hospital. Initially, his complaint was that of back pain, but family members report that the patient has been increasingly confused in recent days. They have noted that he has had progressive confusion and cognitive decline over the last 6 months, but has worsened in the past few days prior to admission. In the emergency department, the patient was noted to have WBC 22.5 with unremarkable BMP. Chest x-ray showed bilateral multiple focal infiltrates, left greater than right with small left pleural effusion. The patient was started on azithromycin and ceftriaxone. Throughout the hospitalization, the patient the patient had episodes of agitation requiring Haldol.  Assessment/Plan: Lobar pneumonia -Continue ceftriaxone and azithromycin -WBC improving -CURB-65 score = 3 -follow blood cultures--neg to date  Acute respiratory failure with hypoxia -secondary to PNA -repeat CXR-->increasing left pleural effusion-->suspect parapneumonic as WBC is improving and pt is afebrile -request IR thoracocentesis with cell count and culture and cytology -stable on 1L [place]  Acute metabolic encephalopathy -secondary to infection process and AKI -B12--362 -TSH--2.408 -check RPR--neg -check ammonia--20 -family gives hx of wandering out of house at night and still driving and getting lost -seroquel 75 mg at hs -discussed risks, benefits, alternatives with HPOA who agrees with seroquel -speech therapy eval-->regular diet with thin liquid  AKI -serum creatinine improved with IVF 1.1-->0.76 -improved with IVF  Dementia -continue aricept and  namenda -quetiapine at hs -has required intermittent haldol for agitation  HTN -continue amlodipine -BP acceptable  Hypokalemia -replete -mag 2.0   Disposition Plan: Likely SNF 3/22 pending results of thoracocentesis Family Communication: updated POA on phone 3/20--Total time spent 35 minutes.  Greater than 50% spent face to face counseling and coordinating care.   Consultants: none  Code Status: FULL   DVT Prophylaxis: [place] Lovenox   Procedures: As Listed in Progress Note Above  Antibiotics: Ceftriaxone 09/06/16>>> Azithromycin 09/06/16>>>   Subjective: Patient is presently confused. He denies any chest pain, short of breath, abdominal pain. Remainder review of systems unobtainable secondary to patient's agitation.  Objective: Vitals:   09/09/16 0942 09/09/16 1026 09/09/16 1235 09/09/16 1243  BP: (!) 163/69     Pulse:      Resp:    20  Temp:      TempSrc:      SpO2:  91% 92% 93%  Weight:      Height:        Intake/Output Summary (Last 24 hours) at 09/09/16 1406 Last data filed at 09/09/16 0950  Gross per 24 hour  Intake              980 ml  Output              295 ml  Net              685 ml   Weight change:  Exam:   General:  Pt is alert, follows commands Intermittently, not in acute distress  HEENT: No icterus, No thrush, No neck mass, [place]/AT  Cardiovascular: RRR, S1/S2, no rubs, no gallops  Respiratory: Bibasilar rales. Good air movement.  Abdomen: Soft/+BS, non tender, non distended, no guarding  Extremities: No edema, No lymphangitis, No petechiae, No rashes,  no synovitis   Data Reviewed: I have personally reviewed following labs and imaging studies Basic Metabolic Panel:  Recent Labs Lab 09/06/16 1612 09/07/16 0411 09/08/16 0843 09/09/16 0450  NA 143 142 140 143  K 3.3* 3.7 3.4* 3.6  CL 105 109 109 109  CO2  --  '25 25 26  '$ GLUCOSE 119* 125* 110* 121*  BUN 34* 29* 28* 25*  CREATININE 1.10 0.86 0.76 0.83  CALCIUM   --  8.7* 8.4* 8.4*  MG  --   --  2.0  --    Liver Function Tests: No results for input(s): AST, ALT, ALKPHOS, BILITOT, PROT, ALBUMIN in the last 168 hours. No results for input(s): LIPASE, AMYLASE in the last 168 hours. No results for input(s): AMMONIA in the last 168 hours. Coagulation Profile: No results for input(s): INR, PROTIME in the last 168 hours. CBC:  Recent Labs Lab 09/06/16 1554 09/06/16 1612 09/07/16 0411 09/08/16 0843 09/09/16 0450  WBC 22.5*  --  19.2* 13.1* 9.9  NEUTROABS 19.7*  --   --   --   --   HGB 12.3* 12.6* 12.0* 10.9* 11.3*  HCT 36.7* 37.0* 36.6* 32.3* 33.9*  MCV 96.8  --  96.8 97.0 93.9  PLT 189  --  189 188 223   Cardiac Enzymes: No results for input(s): CKTOTAL, CKMB, CKMBINDEX, TROPONINI in the last 168 hours. BNP: Invalid input(s): POCBNP CBG: No results for input(s): GLUCAP in the last 168 hours. HbA1C: No results for input(s): HGBA1C in the last 72 hours. Urine analysis:    Component Value Date/Time   COLORURINE AMBER (A) 09/06/2016 1615   APPEARANCEUR CLEAR 09/06/2016 1615   LABSPEC 1.029 09/06/2016 1615   PHURINE 5.0 09/06/2016 1615   GLUCOSEU NEGATIVE 09/06/2016 1615   HGBUR NEGATIVE 09/06/2016 1615   BILIRUBINUR SMALL (A) 09/06/2016 1615   KETONESUR 5 (A) 09/06/2016 1615   PROTEINUR 30 (A) 09/06/2016 1615   NITRITE NEGATIVE 09/06/2016 1615   LEUKOCYTESUR NEGATIVE 09/06/2016 1615   Sepsis Labs: '@LABRCNTIP'$ (procalcitonin:4,lacticidven:4) ) Recent Results (from the past 240 hour(s))  Culture, blood (routine x 2) Call MD if unable to obtain prior to antibiotics being given     Status: None (Preliminary result)   Collection Time: 09/06/16  7:25 PM  Result Value Ref Range Status   Specimen Description BLOOD LEFT ANTECUBITAL  Final   Special Requests BOTTLES DRAWN AEROBIC ONLY 5ML  Final   Culture   Final    NO GROWTH 1 DAY Performed at [facility], [place] [address], [place], [place] [address]    Report Status PENDING   Incomplete  Culture, blood (routine x 2) Call MD if unable to obtain prior to antibiotics being given     Status: None (Preliminary result)   Collection Time: 09/06/16  7:25 PM  Result Value Ref Range Status   Specimen Description BLOOD LEFT HAND  Final   Special Requests IN PEDIATRIC BOTTLE 4ML  Final   Culture   Final    NO GROWTH 1 DAY Performed at [facility], [place] [address], [place], [place] [address]    Report Status PENDING  Incomplete     Scheduled Meds: . amLODipine  5 mg Oral Daily  . azithromycin  500 mg Oral Daily  . cefTRIAXone (ROCEPHIN)  IV  1 g Intravenous Q24H  . docusate sodium  100 mg Oral BID  . donepezil  5 mg Oral Daily  . enoxaparin (LOVENOX) injection  40 mg Subcutaneous Q24H  .  ipratropium-albuterol  3 mL Nebulization TID  . memantine  28 mg Oral Daily  . QUEtiapine  75 mg Oral QHS   Continuous Infusions: . dextrose 5 % and 0.9% NaCl 50 mL/hr at 09/09/16 0057    Procedures/Studies: Dg Chest 2 View  Result Date: 09/06/2016 CLINICAL DATA:  Productive cough a few days with low back pain. EXAM: CHEST  2 VIEW COMPARISON:  None. FINDINGS: Patient rotated to the left. Lungs are adequately inflated with patchy airspace opacification throughout the left lung worse over the lung base. Mild opacification over the right mid to lower lung. Findings likely due to infection with associated left pleural effusion. Two surgical clips over the right hilum. Cardiomediastinal silhouette is within normal. There is calcified plaque over the aortic arch. There are degenerative changes of the spine. IMPRESSION: Bilateral multifocal airspace process left worse than right likely a pneumonia. Associated left-sided pleural effusion. Aortic atherosclerosis. Electronically Signed   By: Marin Olp M.D.   On: 09/06/2016 16:19    Deshonda Cryderman, DO  [facility] Pager [phone]  If 7PM-7AM, please contact night-coverage [email or web address] Password [email or web address] 09/09/2016, 2:06 PM    LOS: 2 days

## 2016-09-09 NOTE — Progress Notes (Signed)
I spoke with POA,Jamie,to obtain consent for US thoracentesis, said she will come tonight or early morning to sign consent. She also mentioned to this writer if MD can d/c Haldol she said that might be causing more agitation to pt. and changed med to Klonopin which his primary MD prescribed before. Message relayed to Dr. Carles Collet via text.

## 2016-09-09 NOTE — NC FL2 (Signed)
[place] LEVEL OF CARE SCREENING TOOL     IDENTIFICATION  Patient Name: Ronald Singh Birthdate: 1921-10-30 Sex: male Admission Date (Current Location): 09/06/2016  [facility] [place] [facility] [place] Number:  Herbalist and Address:  [facility],  [place] [address] [place]      Provider Number: [id number]  Attending Physician Name and Address:  Orson Eva, MD  Relative Name and Phone Number:       Current Level of Care: Hospital Recommended Level of Care: [place] Prior Approval Number:    Date Approved/Denied:   PASRR Number:    Discharge Plan: SNF    Current Diagnoses: Patient Active Problem List   Diagnosis Date Noted  . Acute respiratory failure with hypoxia ([place]) 09/09/2016  . Lobar pneumonia ([place]) 09/07/2016  . Acute metabolic encephalopathy 87/86/7672  . AKI (acute kidney injury) ([place]) 09/07/2016  . Essential hypertension 09/07/2016  . Dementia with behavioral problem 09/07/2016  . PNA (pneumonia) 09/06/2016    Orientation RESPIRATION BLADDER Height & Weight     Self, Place  O2 (1L) Continent Weight: 133 lb 9.6 oz (60.6 kg) Height:  '5\' 7"'$  (170.2 cm)  BEHAVIORAL SYMPTOMS/MOOD NEUROLOGICAL BOWEL NUTRITION STATUS      Incontinent Diet (See summary.)  AMBULATORY STATUS COMMUNICATION OF NEEDS Skin   Extensive Assist Verbally Normal                       Personal Care Assistance Level of Assistance  Bathing, Feeding, Dressing Bathing Assistance: Limited assistance Feeding assistance: Limited assistance Dressing Assistance: Limited assistance     Functional Limitations Info  Sight, Hearing, Speech Sight Info: Adequate Hearing Info: Adequate Speech Info: Adequate    SPECIAL CARE FACTORS FREQUENCY  OT (By licensed OT), PT (By licensed PT)     PT Frequency: 5x OT Frequency: 5x            Contractures Contractures Info: Not present    Additional Factors Info  Code Status, Allergies,  Psychotropic Code Status Info: Full Code Allergies Info: Penicillin Psychotropic Info: seroquel,          Current Medications (09/09/2016):  This is the current hospital active medication list Current Facility-Administered Medications  Medication Dose Route Frequency Provider Last Rate Last Dose  . acetaminophen (TYLENOL) tablet 650 mg  650 mg Oral Q6H PRN Belkys A Regalado, MD       Or  . acetaminophen (TYLENOL) suppository 650 mg  650 mg Rectal Q6H PRN Belkys A Regalado, MD      . amLODipine (NORVASC) tablet 5 mg  5 mg Oral Daily Belkys A Regalado, MD   5 mg at 09/09/16 0942  . azithromycin (ZITHROMAX) tablet 500 mg  500 mg Oral Daily Minda Ditto, RPH   500 mg at 09/09/16 0947  . cefTRIAXone (ROCEPHIN) 1 g in dextrose 5 % 50 mL IVPB  1 g Intravenous Q24H Belkys A Regalado, MD   1 g at 09/08/16 1720  . dextrose 5 %-0.9 % sodium chloride infusion   Intravenous Continuous Belkys A Regalado, MD 50 mL/hr at 09/09/16 0057    . docusate sodium (COLACE) capsule 100 mg  100 mg Oral BID Belkys A Regalado, MD   100 mg at 09/08/16 2059  . donepezil (ARICEPT) tablet 5 mg  5 mg Oral Daily Belkys A Regalado, MD   5 mg at 09/09/16 0942  . enoxaparin (LOVENOX) injection 40 mg  40 mg Subcutaneous Q24H Anh  P Pham, RPH   40 mg at 09/08/16 2059  . haloperidol lactate (HALDOL) injection 2 mg  2 mg Intravenous Q6H PRN Orson Eva, MD   2 mg at 09/09/16 1213  . ibuprofen (ADVIL,MOTRIN) tablet 400 mg  400 mg Oral Q6H PRN Belkys A Regalado, MD      . ipratropium-albuterol (DUONEB) 0.5-2.5 (3) MG/3ML nebulizer solution 3 mL  3 mL Nebulization Q6H PRN Belkys A Regalado, MD      . ipratropium-albuterol (DUONEB) 0.5-2.5 (3) MG/3ML nebulizer solution 3 mL  3 mL Nebulization TID Orson Eva, MD   3 mL at 09/09/16 1242  . memantine (NAMENDA XR) 24 hr capsule 28 mg  28 mg Oral Daily Belkys A Regalado, MD   28 mg at 09/09/16 1036  . ondansetron (ZOFRAN) tablet 4 mg  4 mg Oral Q6H PRN Belkys A Regalado, MD       Or  .  ondansetron (ZOFRAN) injection 4 mg  4 mg Intravenous Q6H PRN Belkys A Regalado, MD      . QUEtiapine (SEROQUEL) tablet 75 mg  75 mg Oral QHS Orson Eva, MD   75 mg at 09/08/16 2059  . senna-docusate (Senokot-S) tablet 1 tablet  1 tablet Oral QHS PRN Elmarie Shiley, MD         Discharge Medications: Please see discharge summary for a list of discharge medications.  Relevant Imaging Results:  Relevant Lab Results:   Additional Information SSN:  [id number]  [place], [place]

## 2016-09-10 ENCOUNTER — Inpatient Hospital Stay (HOSPITAL_COMMUNITY): Payer: Medicare HMO

## 2016-09-10 DIAGNOSIS — J9601 Acute respiratory failure with hypoxia: Secondary | ICD-10-CM

## 2016-09-10 DIAGNOSIS — J9811 Atelectasis: Secondary | ICD-10-CM

## 2016-09-10 DIAGNOSIS — J189 Pneumonia, unspecified organism: Secondary | ICD-10-CM | POA: Diagnosis present

## 2016-09-10 LAB — CBC
HCT: 31.3 % — ABNORMAL LOW (ref 39.0–52.0)
Hemoglobin: 10.5 g/dL — ABNORMAL LOW (ref 13.0–17.0)
MCH: 32.3 pg (ref 26.0–34.0)
MCHC: 33.5 g/dL (ref 30.0–36.0)
MCV: 96.3 fL (ref 78.0–100.0)
Platelets: 244 10*3/uL (ref 150–400)
RBC: 3.25 MIL/uL — ABNORMAL LOW (ref 4.22–5.81)
RDW: 14 % (ref 11.5–15.5)
WBC: 7.7 10*3/uL (ref 4.0–10.5)

## 2016-09-10 LAB — BASIC METABOLIC PANEL
Anion gap: 7 (ref 5–15)
BUN: 23 mg/dL — AB (ref 6–20)
CO2: 26 mmol/L (ref 22–32)
CREATININE: 0.85 mg/dL (ref 0.61–1.24)
Calcium: 8.2 mg/dL — ABNORMAL LOW (ref 8.9–10.3)
Chloride: 111 mmol/L (ref 101–111)
GFR calc Af Amer: >60 mL/min (ref >60)
GLUCOSE: 85 mg/dL (ref 65–99)
Potassium: 3.4 mmol/L — ABNORMAL LOW (ref 3.5–5.1)
SODIUM: 144 mmol/L (ref 135–145)

## 2016-09-10 LAB — BODY FLUID CELL COUNT WITH DIFFERENTIAL
EOS FL: 0 %
Lymphs, Fluid: 5 %
MONOCYTE-MACROPHAGE-SEROUS FLUID: 52 % (ref 50–90)
Neutrophil Count, Fluid: 43 % — ABNORMAL HIGH (ref 0–25)
Total Nucleated Cell Count, Fluid: 393 cu mm (ref 0–1000)

## 2016-09-10 LAB — GLUCOSE, PLEURAL OR PERITONEAL FLUID: Glucose, Fluid: 71 mg/dL

## 2016-09-10 LAB — PROTEIN, PLEURAL OR PERITONEAL FLUID: Total protein, fluid: 3.1 g/dL

## 2016-09-10 LAB — GRAM STAIN

## 2016-09-10 LAB — LACTATE DEHYDROGENASE, PLEURAL OR PERITONEAL FLUID: LD, Fluid: 675 U/L — ABNORMAL HIGH (ref 3–23)

## 2016-09-10 MED ORDER — GUAIFENESIN ER 600 MG PO TB12
1200.0000 mg | ORAL_TABLET | Freq: Two times a day (BID) | ORAL | Status: DC
  Administered 2016-09-10: 1200 mg via ORAL
  Filled 2016-09-10 (×2): qty 2

## 2016-09-10 MED ORDER — CLONAZEPAM 0.25 MG PO TBDP
0.2500 mg | ORAL_TABLET | Freq: Three times a day (TID) | ORAL | Status: DC | PRN
Start: 2016-09-10 — End: 2016-09-11

## 2016-09-10 MED ORDER — LIP MEDEX EX OINT
TOPICAL_OINTMENT | CUTANEOUS | Status: DC | PRN
  Filled 2016-09-10: qty 7

## 2016-09-10 NOTE — Progress Notes (Signed)
PROGRESS NOTE    Ronald Singh  EQA:[id number] DOB: 21-Mar-1922 DOA: 09/06/2016 PCP: Geoffery Lyons, MD    Brief Narrative:  83 year old male with a history of dementia and hypertensionpresented with 3 daysof dyspnea, cough, and subjective fevers and chills. Apparently, the patient had an episode of nausea and vomiting prior to admission to the hospital. Initially, his complaint was that of back pain, but family members report that the patient has been increasingly confused in recent days. They have noted that he has had progressive confusion and cognitive decline over the last 6 months, but has worsened in the past few days prior to admission. In the emergency department, the patient was noted to have WBC 22.5 with unremarkable BMP. Chest x-ray showed bilateral multiple focal infiltrates, left greater than right with small left pleural effusion. The patient was started on azithromycin and ceftriaxone. Throughout the hospitalization, the patient the patient had episodes of agitation requiring Haldol.   Assessment & Plan:   Active Problems:   PNA (pneumonia)   Lobar pneumonia ([place])   Acute metabolic encephalopathy   AKI (acute kidney injury) ([place])   Essential hypertension   Dementia with behavioral problem   Acute respiratory failure with hypoxia (HCC)   Pleural effusion, left   Collapse of left lung   Community acquired pneumonia  #1 community-acquired pneumonia/left lower lobe parapneumonic effusion concerning for loculation versus empyema/left lower lobe collapse Chest x-ray done 09/09/2016 with worsening effusion with concerns for possible loculation. Patient noted to be agitated with confusion during this hospitalization. Patient currently afebrile. Leukocytosis trending down. Patient for ultrasound-guided thoracentesis today. Repeat CT chest after thoracentesis is done per recommendations of PCCM. It is noted that due to patient's age and dementia likely not a candidate for VATS  procedure. Also patient may be not a candidate for possible chest tube placement. Continue empiric IV Rocephin and oral azithromycin. Continue duo nebs. Add Mucinex.PCCM consulted and appreciate input and recommendations. Pulmonary recommending palliative care consultation for goals of care. Follow for now.  #2 acute respiratory failure with hypoxia Likely secondary to problem #1. Patient for diagnostic and therapeutic ultrasound-guided thoracentesis today. CT of the chest to be done postthoracentesis per PCCM recommendations.  #3 acute encephalopathy in the setting of underlying dementia Likely secondary to acute pulmonary infection as noted in problem #1. Blood cultures pending. Sputum Gram stain and culture pending. Patient for therapeutic and diagnostic thoracentesis today. Continue empiric IV antibiotics. It is noted per nursing that family states Haldol as made his agitation worse and as such will discontinue Haldol. Place on anxiolytics as needed. Continue Seroquel daily at bedtime. B-12 levels at 362. TSH at 2.408. RPR negative. Ammonia level at 20. Follow.  #4 hypertension Stable. Continue Norvasc.  #5 dementia Continue Aricept and Namenda.  #6 hypokalemia Replete.  #7 acute kidney injury Improved with hydration.   DVT prophylaxis: Lovenox Code Status: Full Family Communication: No family at bedside. Disposition Plan: Pending hospital course home versus skilled nursing facility.   Consultants:   Palliative care pending  PCCM: dr Vaughan Browner 09/10/2016  Procedures:   Ultrasound-guided thoracentesis per IR pending 09/10/2016  Chest x-ray 09/06/2016, 09/09/2016  CT chest pending 09/10/2016  Antimicrobials:   IV Rocephin 09/06/2016  IV azithromycin 09/06/2016>>>> oral azithromycin 09/07/2016   Subjective: Patient sleeping. Patient noted to be agitated and currently in [place].  Objective: Vitals:   09/10/16 0804 09/10/16 1336 09/10/16 1432 09/10/16 1623  BP:    (!) 109/52 134/73  Pulse:    78  Resp:  18  Temp:    97.8 F (36.6 C)  TempSrc:    Oral  SpO2: 93% 95%  97%  Weight:      Height:        Intake/Output Summary (Last 24 hours) at 09/10/16 1858 Last data filed at 09/10/16 1823  Gross per 24 hour  Intake              700 ml  Output                0 ml  Net              700 ml   Filed Weights   09/06/16 1852  Weight: 60.6 kg (133 lb 9.6 oz)    Examination:  General exam: Appears calm and comfortable.Sleeping. [place]. Respiratory system: Decreased breath sounds in the left base. Some scattered coarse breath sounds/ rhonchi. Respiratory effort normal. Cardiovascular system: S1 & S2 heard, RRR. No JVD, murmurs, rubs, gallops or clicks. No pedal edema. Gastrointestinal system: Abdomen is nondistended, soft and nontender. No organomegaly or masses felt. Normal bowel sounds heard. Central nervous system: Alert and oriented. No focal neurological deficits. Extremities: Symmetric 5 x 5 power. Skin: No rashes, lesions or ulcers Psychiatry: Judgement and insight appear normal. Mood & affect appropriate.     Data Reviewed: I have personally reviewed following labs and imaging studies  CBC:  Recent Labs Lab 09/06/16 1554 09/06/16 1612 09/07/16 0411 09/08/16 0843 09/09/16 0450 09/10/16 0405  WBC 22.5*  --  19.2* 13.1* 9.9 7.7  NEUTROABS 19.7*  --   --   --   --   --   HGB 12.3* 12.6* 12.0* 10.9* 11.3* 10.5*  HCT 36.7* 37.0* 36.6* 32.3* 33.9* 31.3*  MCV 96.8  --  96.8 97.0 93.9 96.3  PLT 189  --  189 188 223 379   Basic Metabolic Panel:  Recent Labs Lab 09/06/16 1612 09/07/16 0411 09/08/16 0843 09/09/16 0450 09/10/16 0405  NA 143 142 140 143 144  K 3.3* 3.7 3.4* 3.6 3.4*  CL 105 109 109 109 111  CO2  --  '25 25 26 26  '$ GLUCOSE 119* 125* 110* 121* 85  BUN 34* 29* 28* 25* 23*  CREATININE 1.10 0.86 0.76 0.83 0.85  CALCIUM  --  8.7* 8.4* 8.4* 8.2*  MG  --   --  2.0  --   --    GFR: Estimated Creatinine Clearance:  47.5 mL/min (by C-G formula based on SCr of 0.85 mg/dL). Liver Function Tests: No results for input(s): AST, ALT, ALKPHOS, BILITOT, PROT, ALBUMIN in the last 168 hours. No results for input(s): LIPASE, AMYLASE in the last 168 hours.  Recent Labs Lab 09/09/16 1441  AMMONIA 20   Coagulation Profile: No results for input(s): INR, PROTIME in the last 168 hours. Cardiac Enzymes:  Recent Labs Lab 09/09/16 1441  CKTOTAL 179   BNP (last 3 results) No results for input(s): PROBNP in the last 8760 hours. HbA1C: No results for input(s): HGBA1C in the last 72 hours. CBG: No results for input(s): GLUCAP in the last 168 hours. Lipid Profile: No results for input(s): CHOL, HDL, LDLCALC, TRIG, CHOLHDL, LDLDIRECT in the last 72 hours. Thyroid Function Tests: No results for input(s): TSH, T4TOTAL, FREET4, T3FREE, THYROIDAB in the last 72 hours. Anemia Panel: No results for input(s): VITAMINB12, FOLATE, FERRITIN, TIBC, IRON, RETICCTPCT in the last 72 hours. Sepsis Labs: No results for input(s): PROCALCITON, LATICACIDVEN in the last 168 hours.  Recent Results (from  the past 240 hour(s))  Culture, blood (routine x 2) Call MD if unable to obtain prior to antibiotics being given     Status: None (Preliminary result)   Collection Time: 09/06/16  7:25 PM  Result Value Ref Range Status   Specimen Description BLOOD LEFT ANTECUBITAL  Final   Special Requests BOTTLES DRAWN AEROBIC ONLY 5ML  Final   Culture   Final    NO GROWTH 3 DAYS Performed at [facility], [place] [address], [place], [place] [address]    Report Status PENDING  Incomplete  Culture, blood (routine x 2) Call MD if unable to obtain prior to antibiotics being given     Status: None (Preliminary result)   Collection Time: 09/06/16  7:25 PM  Result Value Ref Range Status   Specimen Description BLOOD LEFT HAND  Final   Special Requests IN PEDIATRIC BOTTLE 4ML  Final   Culture   Final    NO GROWTH 3 DAYS Performed at [facility], [address], [place], [place] [address]    Report Status PENDING  Incomplete         Radiology Studies: Ct Chest Wo Contrast  Result Date: 09/10/2016 CLINICAL DATA:  Dyspnea, cough, and subjective fever and chills. Left lung collapse. Pleural effusion. Progressive confusion. EXAM: CT CHEST WITHOUT CONTRAST TECHNIQUE: Multidetector CT imaging of the chest was performed following the standard protocol without IV contrast. COMPARISON:  Chest radiograph 09/09/2016 FINDINGS: Cardiovascular: Relatively diffuse thoracic aortic calcified atherosclerosis without aneurysm. LAD coronary artery atherosclerosis. Normal heart size. No pericardial effusion. Mediastinum/Nodes: Mildly increased number of nonenlarged mediastinal lymph nodes. No definite hilar lymph node enlargement within limitations of noncontrast technique. No enlarged axillary lymph nodes. Lungs/Pleura: No pneumothorax. Calcified pleural plaques bilaterally. Very small layering right pleural effusion. Small multiloculated left pleural effusion including small loculated components medially along the mediastinal pleura, anteriorly in the lung base, and in the major fissure. Prior right upper lobectomy. Dependent subsegmental atelectasis in the right lower lobe. A small amount of mucus/debris is present in the left mainstem bronchus. There is partial left lower lobe collapse with additional patchy consolidative and nodular opacities in the superior segment. Mild compressive atelectasis is present in the left upper lobe/lingula. Upper Abdomen: Partially visualized calcified stones in the gallbladder. Musculoskeletal: Diffuse thoracic spondylosis. No suspicious osseous lesion. IMPRESSION: 1. Small loculated left pleural effusion. No pneumothorax following thoracentesis. 2. Very small layering right pleural effusion. 3. Partial left lower lobe collapse. Patchy opacities in the aerated portion of the left lower lobe may reflect pneumonia. 4.  Calcified pleural plaques suggesting prior asbestos exposure. 5. Aortic atherosclerosis. 6. Cholelithiasis. Electronically Signed   By: Logan Bores M.D.   On: 09/10/2016 16:21   Dg Chest Port 1 View  Result Date: 09/09/2016 CLINICAL DATA:  Hypoxia EXAM: PORTABLE CHEST 1 VIEW COMPARISON:  09/06/2016 FINDINGS: Interval enlargement of the left effusion now extending superiorly to the left chest apex. Loculation not excluded. Associated left mid and lower lung collapse/ consolidation. Stable cardiomegaly. No definite superimposed edema. Right lung remains clear. Postop changes in the right hilum. Thoracic aortic atherosclerosis noted. IMPRESSION: Enlarging left pleural effusion compared 09/06/2016. This may be loculated. Increased left mid and lower lung collapse/ consolidation. Cardiomegaly without edema. Thoracic aortic atherosclerosis Electronically Signed   By: Jerilynn Mages.  Shick M.D.   On: 09/09/2016 14:54   US Thoracentesis Asp Pleural Space W/img Guide  Result Date: 09/10/2016 INDICATION: Pneumonia with left parapneumonic effusion ,dementia/encephalopathy, ; request made for  diagnostic and therapeutic left thoracentesis. EXAM: ULTRASOUND GUIDED DIAGNOSTIC AND THERAPEUTIC LEFT THORACENTESIS MEDICATIONS: None. COMPLICATIONS: None immediate. PROCEDURE: An ultrasound guided thoracentesis was thoroughly discussed with the patient's POA and questions answered. The benefits, risks, alternatives and complications were also discussed. The patient's POA understands and wishes to proceed with the procedure. Written consent was obtained. Ultrasound was performed to localize and mark an adequate pocket of fluid in the left chest. The area was then prepped and draped in the normal sterile fashion. 1% Lidocaine was used for local anesthesia. Under ultrasound guidance a Safe-T-Centesis catheter was introduced. Thoracentesis was performed. The catheter was removed and a dressing applied. FINDINGS: A total of approximately 490 cc  of turbid, amber fluid was removed. Samples were sent to the laboratory as requested by the clinical team. Due to the multiloculated nature of the left pleural collection only the above amount of fluid could be removed at this time. IMPRESSION: Successful ultrasound guided diagnostic and therapeutic left thoracentesis yielding 490 cc of pleural fluid. Follow-up CT chest pending. Read by: Rowe Robert, PA-C Electronically Signed   By: Jacqulynn Cadet M.D.   On: 09/10/2016 15:08        Scheduled Meds: . amLODipine  5 mg Oral Daily  . azithromycin  500 mg Oral Daily  . cefTRIAXone (ROCEPHIN)  IV  1 g Intravenous Q24H  . docusate sodium  100 mg Oral BID  . donepezil  5 mg Oral Daily  . enoxaparin (LOVENOX) injection  40 mg Subcutaneous Q24H  . guaiFENesin  1,200 mg Oral BID  . ipratropium-albuterol  3 mL Nebulization TID  . memantine  28 mg Oral Daily  . QUEtiapine  75 mg Oral QHS   Continuous Infusions:   LOS: 3 days    Time spent: 40 minutes    Jameeka Marcy, MD [facility] Pager [phone]  If 7PM-7AM, please contact night-coverage [email or web address] Password [facility] 09/10/2016, 6:58 PM

## 2016-09-10 NOTE — Progress Notes (Signed)
PT Cancellation Note  Patient Details Name: EEAN BUSS MRN: [id number] DOB: 1922/01/28   Cancelled Treatment:     pt out of room for a Thoracentesis.  Will check back later as schedule permits.    Rica Koyanagi  PTA [facility]  Acute  Rehab Pager      [phone]

## 2016-09-10 NOTE — Progress Notes (Signed)
LCSW following and assisting with disposition and aware of current [place] meeting. Following for possible placement, but if patient more comfort or custodial care his insurance will not pay for nursing facility and patient does not qualify for medicaid.  LCSW will continue to follow and assist with transition and emotional support. POA aware of plan and will be the main decision maker.  Lane Hacker, MSW Clinical Social Work: [employer] Coverage for :  [phone]

## 2016-09-10 NOTE — Procedures (Signed)
Ultrasound-guided diagnostic and therapeutic left thoracentesis performed yielding 490 cc of turbid, amber fluid. No immediate complications. Follow-up CT chest pending. The fluid was sent to the lab for preordered studies. The left pleural collection was multiloculated and only the above amount of fluid could be aspirated at this time.
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
## 2016-09-10 NOTE — Progress Notes (Signed)
No charge note.  PMT meeting scheduled with niece tomorrow morning 3/22 at 8:15 am.  Imogene Burn, [place] [facility] Pager: [phone]

## 2016-09-11 DIAGNOSIS — R0902 Hypoxemia: Secondary | ICD-10-CM

## 2016-09-11 DIAGNOSIS — J90 Pleural effusion, not elsewhere classified: Secondary | ICD-10-CM

## 2016-09-11 DIAGNOSIS — J918 Pleural effusion in other conditions classified elsewhere: Secondary | ICD-10-CM

## 2016-09-11 DIAGNOSIS — J189 Pneumonia, unspecified organism: Secondary | ICD-10-CM | POA: Diagnosis present

## 2016-09-11 DIAGNOSIS — F0151 Vascular dementia with behavioral disturbance: Secondary | ICD-10-CM

## 2016-09-11 DIAGNOSIS — Z7189 Other specified counseling: Secondary | ICD-10-CM

## 2016-09-11 DIAGNOSIS — Z515 Encounter for palliative care: Secondary | ICD-10-CM

## 2016-09-11 LAB — CBC WITH DIFFERENTIAL/PLATELET
BASOS PCT: 0 %
Basophils Absolute: 0 10*3/uL (ref 0.0–0.1)
Eosinophils Absolute: 0.2 10*3/uL (ref 0.0–0.7)
Eosinophils Relative: 2 %
HEMATOCRIT: 35.7 % — AB (ref 39.0–52.0)
HEMOGLOBIN: 11.8 g/dL — AB (ref 13.0–17.0)
LYMPHS ABS: 1.1 10*3/uL (ref 0.7–4.0)
Lymphocytes Relative: 11 %
MCH: 32 pg (ref 26.0–34.0)
MCHC: 33.1 g/dL (ref 30.0–36.0)
MCV: 96.7 fL (ref 78.0–100.0)
MONOS PCT: 7 %
Monocytes Absolute: 0.6 10*3/uL (ref 0.1–1.0)
NEUTROS ABS: 7.3 10*3/uL (ref 1.7–7.7)
NEUTROS PCT: 80 %
Platelets: 269 10*3/uL (ref 150–400)
RBC: 3.69 MIL/uL — AB (ref 4.22–5.81)
RDW: 13.9 % (ref 11.5–15.5)
WBC: 9.2 10*3/uL (ref 4.0–10.5)

## 2016-09-11 LAB — COMPREHENSIVE METABOLIC PANEL
ALK PHOS: 191 U/L — AB (ref 38–126)
ALT: 56 U/L (ref 17–63)
AST: 44 U/L — ABNORMAL HIGH (ref 15–41)
Albumin: 2.5 g/dL — ABNORMAL LOW (ref 3.5–5.0)
Anion gap: 6 (ref 5–15)
BILIRUBIN TOTAL: 0.7 mg/dL (ref 0.3–1.2)
BUN: 27 mg/dL — AB (ref 6–20)
CALCIUM: 8.4 mg/dL — AB (ref 8.9–10.3)
CO2: 27 mmol/L (ref 22–32)
CREATININE: 0.74 mg/dL (ref 0.61–1.24)
Chloride: 109 mmol/L (ref 101–111)
GFR calc Af Amer: >60 mL/min (ref >60)
GFR calc non Af Amer: >60 mL/min (ref >60)
Glucose, Bld: 87 mg/dL (ref 65–99)
Potassium: 3.6 mmol/L (ref 3.5–5.1)
Sodium: 142 mmol/L (ref 135–145)
TOTAL PROTEIN: 5.6 g/dL — AB (ref 6.5–8.1)

## 2016-09-11 LAB — MAGNESIUM: Magnesium: 2.2 mg/dL (ref 1.7–2.4)

## 2016-09-11 LAB — LACTATE DEHYDROGENASE: LDH: 145 U/L (ref 98–192)

## 2016-09-11 MED ORDER — SODIUM CHLORIDE 0.9 % IV SOLN: 250.0000 mL | INTRAVENOUS | Status: DC | PRN

## 2016-09-11 MED ORDER — GLYCOPYRROLATE 0.2 MG/ML IJ SOLN
0.2000 mg | INTRAMUSCULAR | Status: DC | PRN
  Filled 2016-09-11: qty 1

## 2016-09-11 MED ORDER — HALOPERIDOL LACTATE 2 MG/ML PO CONC
0.5000 mg | ORAL | Status: DC | PRN
  Filled 2016-09-11: qty 0.3

## 2016-09-11 MED ORDER — BIOTENE DRY MOUTH MT LIQD: 15.0000 mL | OROMUCOSAL | Status: DC | PRN

## 2016-09-11 MED ORDER — MORPHINE SULFATE 20 MG/5ML PO SOLN: 5.0000 mg | ORAL | 0 refills | Status: AC | PRN

## 2016-09-11 MED ORDER — SENNOSIDES-DOCUSATE SODIUM 8.6-50 MG PO TABS: 1.0000 | ORAL_TABLET | Freq: Two times a day (BID) | ORAL | Status: AC

## 2016-09-11 MED ORDER — SODIUM CHLORIDE 0.9% FLUSH: 3.0000 mL | Freq: Two times a day (BID) | INTRAVENOUS | Status: DC

## 2016-09-11 MED ORDER — HALOPERIDOL LACTATE 5 MG/ML IJ SOLN: 0.5000 mg | INTRAMUSCULAR | Status: DC | PRN

## 2016-09-11 MED ORDER — GLYCOPYRROLATE 1 MG PO TABS: 1.0000 mg | ORAL_TABLET | ORAL | 0 refills | Status: AC | PRN

## 2016-09-11 MED ORDER — CLONAZEPAM 0.5 MG PO TBDP: 0.5000 mg | ORAL_TABLET | Freq: Three times a day (TID) | ORAL | Status: DC | PRN

## 2016-09-11 MED ORDER — GUAIFENESIN 100 MG/5ML PO SOLN
5.0000 mL | Freq: Four times a day (QID) | ORAL | Status: DC
  Filled 2016-09-11: qty 10

## 2016-09-11 MED ORDER — HYDROMORPHONE HCL 1 MG/ML IJ SOLN: 0.5000 mg | INTRAMUSCULAR | Status: DC | PRN

## 2016-09-11 MED ORDER — GUAIFENESIN 100 MG/5ML PO SOLN: 5.0000 mL | Freq: Four times a day (QID) | ORAL | 0 refills | Status: AC | PRN

## 2016-09-11 MED ORDER — GLYCOPYRROLATE 1 MG PO TABS
1.0000 mg | ORAL_TABLET | ORAL | Status: DC | PRN
  Filled 2016-09-11: qty 1

## 2016-09-11 MED ORDER — HALOPERIDOL 0.5 MG PO TABS
0.5000 mg | ORAL_TABLET | ORAL | Status: DC | PRN
  Filled 2016-09-11: qty 1

## 2016-09-11 MED ORDER — QUETIAPINE FUMARATE 25 MG PO TABS: 75.0000 mg | ORAL_TABLET | Freq: Every day | ORAL | 0 refills | Status: AC

## 2016-09-11 MED ORDER — DOCUSATE SODIUM 50 MG/5ML PO LIQD
100.0000 mg | Freq: Two times a day (BID) | ORAL | Status: DC
  Filled 2016-09-11 (×2): qty 10

## 2016-09-11 MED ORDER — POLYVINYL ALCOHOL 1.4 % OP SOLN
1.0000 [drp] | Freq: Four times a day (QID) | OPHTHALMIC | Status: DC | PRN
  Filled 2016-09-11: qty 15

## 2016-09-11 MED ORDER — CLONAZEPAM 0.5 MG PO TBDP: 0.5000 mg | ORAL_TABLET | Freq: Three times a day (TID) | ORAL | 0 refills | Status: AC | PRN

## 2016-09-11 MED ORDER — SODIUM CHLORIDE 0.9% FLUSH: 3.0000 mL | INTRAVENOUS | Status: DC | PRN

## 2016-09-11 NOTE — Progress Notes (Signed)
Name: Ronald Singh MRN: [id number] DOB: Dec 23, 1921    ADMISSION DATE:  09/06/2016 CONSULTATION DATE:  3/21  REFERRING MD :  Grandville Silos   CHIEF COMPLAINT:  Pleural effusion   SUBJECTIVE:  Sedated  VITAL SIGNS: Temp:  [97.8 F (36.6 C)-98.4 F (36.9 C)] 98.4 F (36.9 C) (03/22 0423) Pulse Rate:  [78-87] 87 (03/22 0423) Resp:  [16-18] 16 (03/22 0423) BP: (109-145)/(52-87) 145/87 (03/22 0423) SpO2:  [95 %-99 %] 99 % (03/22 0423)  PHYSICAL EXAMINATION: General appearance:  83 year old  Malecachectic  NAD, currently in acute distress,  Eyes: anicteric sclerae, moist conjunctivae; PERRL, EOMI bilaterally. Mouth:  membranes and no mucosal ulcerations; normal hard and soft palate Neck: Trachea midline; neck supple, no JVD Lungs/chest: diffuse rhonchi, with normal respiratory effort and no intercostal retractions CV: RRR, no MRGs  Abdomen: Soft, non-tender; no masses or HSM Extremities: No peripheral edema or extremity lymphadenopathy Skin: Normal temperature, turgor and texture; no rash, ulcers or subcutaneous nodules Neuro/Psych: minimally responsive   Recent Labs Lab 09/09/16 0450 09/10/16 0405 09/11/16 0405  NA 143 144 142  K 3.6 3.4* 3.6  CL 109 111 109  CO2 '26 26 27  '$ BUN 25* 23* 27*  CREATININE 0.83 0.85 0.74  GLUCOSE 121* 85 87    Recent Labs Lab 09/09/16 0450 09/10/16 0405 09/11/16 0405  HGB 11.3* 10.5* 11.8*  HCT 33.9* 31.3* 35.7*  WBC 9.9 7.7 9.2  PLT 223 244 269   Ct Chest Wo Contrast  Result Date: 09/10/2016 CLINICAL DATA:  Dyspnea, cough, and subjective fever and chills. Left lung collapse. Pleural effusion. Progressive confusion. EXAM: CT CHEST WITHOUT CONTRAST TECHNIQUE: Multidetector CT imaging of the chest was performed following the standard protocol without IV contrast. COMPARISON:  Chest radiograph 09/09/2016 FINDINGS: Cardiovascular: Relatively diffuse thoracic aortic calcified atherosclerosis without aneurysm. LAD coronary artery  atherosclerosis. Normal heart size. No pericardial effusion. Mediastinum/Nodes: Mildly increased number of nonenlarged mediastinal lymph nodes. No definite hilar lymph node enlargement within limitations of noncontrast technique. No enlarged axillary lymph nodes. Lungs/Pleura: No pneumothorax. Calcified pleural plaques bilaterally. Very small layering right pleural effusion. Small multiloculated left pleural effusion including small loculated components medially along the mediastinal pleura, anteriorly in the lung base, and in the major fissure. Prior right upper lobectomy. Dependent subsegmental atelectasis in the right lower lobe. A small amount of mucus/debris is present in the left mainstem bronchus. There is partial left lower lobe collapse with additional patchy consolidative and nodular opacities in the superior segment. Mild compressive atelectasis is present in the left upper lobe/lingula. Upper Abdomen: Partially visualized calcified stones in the gallbladder. Musculoskeletal: Diffuse thoracic spondylosis. No suspicious osseous lesion. IMPRESSION: 1. Small loculated left pleural effusion. No pneumothorax following thoracentesis. 2. Very small layering right pleural effusion. 3. Partial left lower lobe collapse. Patchy opacities in the aerated portion of the left lower lobe may reflect pneumonia. 4. Calcified pleural plaques suggesting prior asbestos exposure. 5. Aortic atherosclerosis. 6. Cholelithiasis. Electronically Signed   By: Logan Bores M.D.   On: 09/10/2016 16:21   Dg Chest Port 1 View  Result Date: 09/09/2016 CLINICAL DATA:  Hypoxia EXAM: PORTABLE CHEST 1 VIEW COMPARISON:  09/06/2016 FINDINGS: Interval enlargement of the left effusion now extending superiorly to the left chest apex. Loculation not excluded. Associated left mid and lower lung collapse/ consolidation. Stable cardiomegaly. No definite superimposed edema. Right lung remains clear. Postop changes in the right hilum. Thoracic  aortic atherosclerosis noted. IMPRESSION: Enlarging left pleural effusion compared 09/06/2016. This may  be loculated. Increased left mid and lower lung collapse/ consolidation. Cardiomegaly without edema. Thoracic aortic atherosclerosis Electronically Signed   By: Jerilynn Mages.  Shick M.D.   On: 09/09/2016 14:54   US Thoracentesis Asp Pleural Space W/img Guide  Result Date: 09/10/2016 INDICATION: Pneumonia with left parapneumonic effusion ,dementia/encephalopathy, ; request made for diagnostic and therapeutic left thoracentesis. EXAM: ULTRASOUND GUIDED DIAGNOSTIC AND THERAPEUTIC LEFT THORACENTESIS MEDICATIONS: None. COMPLICATIONS: None immediate. PROCEDURE: An ultrasound guided thoracentesis was thoroughly discussed with the patient's POA and questions answered. The benefits, risks, alternatives and complications were also discussed. The patient's POA understands and wishes to proceed with the procedure. Written consent was obtained. Ultrasound was performed to localize and mark an adequate pocket of fluid in the left chest. The area was then prepped and draped in the normal sterile fashion. 1% Lidocaine was used for local anesthesia. Under ultrasound guidance a Safe-T-Centesis catheter was introduced. Thoracentesis was performed. The catheter was removed and a dressing applied. FINDINGS: A total of approximately 490 cc of turbid, amber fluid was removed. Samples were sent to the laboratory as requested by the clinical team. Due to the multiloculated nature of the left pleural collection only the above amount of fluid could be removed at this time. IMPRESSION: Successful ultrasound guided diagnostic and therapeutic left thoracentesis yielding 490 cc of pleural fluid. Follow-up CT chest pending. Read by: Rowe Robert, PA-C Electronically Signed   By: Jacqulynn Cadet M.D.   On: 09/10/2016 15:08    ASSESSMENT / PLAN:  Cap vs aspiration PNA w/ loculated left pleural effusion.  480 ml from thora. Abundant WBC, + exudate  per lights so either parapneumonic or empyema. f/u CT imaging w/ persistent multiple loculations.  Do not think he is CT or thoracic surgery candidate due to frail physical state and high risk that aspiration is actually the etiology here so will almost certain happen again even if he were to improve.  Plan Cont abx.  Highly recommend DNR Would even support transition to comfort Nothing else to offer we will s/o but available to speak w/ family if needed.   Erick Colace ACNP-BC [place] Pager # [phone] OR # [phone] if no answer     09/11/2016, 9:58 AM

## 2016-09-11 NOTE — Consult Note (Signed)
[facility] [employer] Received request from [place] for family interest in [facility]. Chart reviewed and met with patient, spouse and HCPOA to confirm interest and complete paper work for transfer today.  Dr. Orpah Melter to assume care per family request.   RN please call report to [phone].  CSW Jarrett Soho has already faxed discharge summary.   Thank you for your help! Erling Conte, [place]

## 2016-09-11 NOTE — Progress Notes (Signed)
Report phoned to [facility].  Pt to be transported at 1600.  Wife at bedside.

## 2016-09-11 NOTE — Consult Note (Signed)
Consultation Note Date: 09/11/2016   Patient Name: Ronald Singh  DOB: 1922-03-03  MRN: [id number]  Age / Sex: 83 y.o., male  PCP: Ronald Bunting, MD Referring Physician: Eugenie Filler, MD  Reason for Consultation: Establishing goals of care, Hospice Evaluation and Psychosocial/spiritual support  HPI/Patient Profile: 83 y.o. male  with past medical history of dementia, htn, and previous lobectomy who was admitted on 09/06/2016 with sob, cough, backpain.  He was found to have multifocal pneumonia and a loculated pleural effusion.  He is being treated for pneumonia and received thoracentesis on 3/21.  The fluid is cloudy with high lactate dehydrogenase.  Pulmonology is on board.  He has had difficulty with delirium (or worsening dementia) during this hospitalization.  Clinical Assessment and Goals of Care:  I have reviewed medical records including EPIC notes, labs and imaging, received report from the bedside RN, assessed the patient and then met at the bedside along with his wife and HCPOA Ronald Singh) to discuss diagnosis prognosis, GOC, EOL wishes, disposition and options.  I introduced Palliative Medicine as specialized medical care for people living with serious illness. It focuses on providing relief from the symptoms and stress of a serious illness. The goal is to improve quality of life for both the patient and the family.  We discussed a brief life review of the patient. 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  This is not his first marriage, and there appears to be a lot of strained family dynamics between his wife and children.  Per his wife he walks on a walker at home and has a very unpredictable temperament.  He has been eating well, but has still be losing weight.  Over the last 6 months his dementia has become very advanced.   His wife expresses repeatedly that she wants him to go "be with Jesus".  We discussed the multifocal pneumonia and loculated pleural effusion and what it means in the larger context of his on-going co-morbidities (hx of lung cancer, advanced dementia).  The family has heard from both pulmonology and internal medicine that Ronald Singh is unable to tolerate surgery/VATS/chest tube.   Natural disease trajectory and expectations at EOL were discussed.  Hospice and Palliative Care services outpatient were explained and offered.  Both the wife and the HCPOA were comforted by the plan for hospice house.  Ronald Singh intends to visit the two closest hospice houses today and then work with Ronald Singh of social work to have her uncle Ronald Singh placed in one of them.  Questions and concerns were addressed. The family was encouraged to call with questions or concerns.     Primary Decision Maker:  Ronald Singh    SUMMARY OF RECOMMENDATIONS    Full comfort care.  D/C interventions not related to comfort.  Transfer to [facility] when bed available.  Code Status/Advance Care Planning:  DNR    Symptom Management:  Low dose dilaudid for pain  Seroquel /  haldol PRN agitation/anxiety  Robinul to dry secretions   Additional Recommendations (Limitations, Scope, Preferences):  Avoid Hospitalization, Full Comfort Care, No Artificial Feeding, No IV Antibiotics, No IV Fluids and No Lab Draws  Palliative Prophylaxis:   Aspiration  Prognosis:   < 2 weeks  Discharge Planning: Hospice facility      Primary Diagnoses: Present on Admission: . PNA (pneumonia)   I have reviewed the medical record, interviewed the patient and family, and examined the patient. The following aspects are pertinent.  Past Medical History:  Diagnosis Date  . Dementia   . Hypertension    Social History   Social History  . Marital status: Married    Spouse name: N/A  . Number of children: N/A  . Years of education:  N/A   Social History Main Topics  . Smoking status: Former Smoker    Packs/day: 2.00    Types: Cigarettes  . Smokeless tobacco: Never Used  . Alcohol use No  . Drug use: No  . Sexual activity: Not Asked   Other Topics Concern  . None   Social History Narrative  . None   History reviewed. No pertinent family history. Scheduled Meds: . amLODipine  5 mg Oral Daily  . azithromycin  500 mg Oral Daily  . cefTRIAXone (ROCEPHIN)  IV  1 g Intravenous Q24H  . docusate sodium  100 mg Oral BID  . donepezil  5 mg Oral Daily  . enoxaparin (LOVENOX) injection  40 mg Subcutaneous Q24H  . guaiFENesin  1,200 mg Oral BID  . ipratropium-albuterol  3 mL Nebulization TID  . memantine  28 mg Oral Daily  . QUEtiapine  75 mg Oral QHS   Continuous Infusions: PRN Meds:.acetaminophen **OR** acetaminophen, clonazepam, ibuprofen, ipratropium-albuterol, lip balm, ondansetron **OR** ondansetron (ZOFRAN) IV, senna-docusate Allergies  Allergen Reactions  . Penicillins     "a long time ago."    Review of Systems Patient does not wake to exam or voice.  Physical Exam  Well developed elderly man.  Sleeping soundly.  Does not wake to my exame CV rrr.  No frank m/r/g Resp:  Sleeping, NAD, No frank w/c/r Abdomen soft, nt, nd Extremities no edema.   Vital Signs: BP (!) 145/87 (BP Location: Right Arm) Comment: RN notified   Pulse 87   Temp 98.4 F (36.9 C) (Oral)   Resp 16   Ht _0  (1.702 m)   Wt 60.6 kg (133 lb 9.6 oz)   SpO2 99%   BMI 20.92 kg/m  Pain Assessment: No/denies pain   Pain Score: 0-No pain   SpO2: SpO2: 99 % O2 Device:SpO2: 99 % O2 Flow Rate: .O2 Flow Rate (L/min): 2 L/min  IO: Intake/output summary:  Intake/Output Summary (Last 24 hours) at 09/11/16 0806 Last data filed at 09/11/16 0423  Gross per 24 hour  Intake              650 ml  Output              200 ml  Net              450 ml    LBM: Last BM Date: 09/10/16 Baseline Weight: Weight: 60.6 kg (133 lb 9.6  oz) Most recent weight: Weight: 60.6 kg (133 lb 9.6 oz)     Palliative Assessment/Data:   Flowsheet Rows     Most Recent Value  Intake Tab  Referral Department  Hospitalist  Unit at Time of Referral  Med/Surg Unit  Palliative Care  Primary Diagnosis  Sepsis/Infectious Disease  Date Notified  09/10/16  Palliative Care Type  New Palliative care  Reason for referral  Clarify Goals of Care, Counsel Regarding Hospice  Date of Admission  09/06/16  Date first seen by Palliative Care  09/10/16  # of days Palliative referral response time  0 Day(s)  # of days IP prior to Palliative referral  4  Clinical Assessment  Palliative Performance Scale Score  20%  Psychosocial & Spiritual Assessment  Palliative Care Outcomes  Patient/Family meeting held?  Yes  Who was at the meeting?  patient and hcpoa  Palliative Care Outcomes  Clarified goals of care, Counseled regarding hospice      Time In: 8:00 Time Out: 10:40 Time Total: 160 min Greater than 50%  of this time was spent counseling and coordinating care related to the above assessment and plan.  Signed by: Imogene Burn, PA-C Palliative Medicine Pager: [phone]  Please contact Palliative Medicine Team phone at [phone] for questions and concerns.  For individual provider: See Shea Evans

## 2016-09-11 NOTE — Care Management Important Message (Signed)
Important Message  Patient Details  Name: Ronald Singh MRN: [id number] Date of Birth: February 08, 1922   Medicare Important Message Given:  Yes    Kerin Salen 09/11/2016, 11:16 AMImportant Message  Patient Details  Name: Ronald Singh MRN: [id number] Date of Birth: Aug 07, 1921   Medicare Important Message Given:  Yes    Kerin Salen 09/11/2016, 11:16 AM

## 2016-09-11 NOTE — Progress Notes (Signed)
This CM alerted [facility] rep that the new disposition plan is for residential Hospice. CM will continue to follow and assist as needed. Marney Doctor RN,BSN,NCM [phone]

## 2016-09-11 NOTE — Discharge Summary (Signed)
Physician Discharge Summary  Ronald Singh NLZ:[id number] DOB: 09-29-1921 DOA: 09/06/2016  PCP: Geoffery Lyons, MD  Admit date: 09/06/2016 Discharge date: 09/11/2016  Time spent: 65 minutes  Recommendations for Outpatient Follow-up:  1. Patient be discharged residential hospice home at [facility]   Discharge Diagnoses:  Principal Problem:   Acute respiratory failure with hypoxia ([place]) Active Problems:   Parapneumonic effusion   Community acquired pneumonia   PNA (pneumonia)   Lobar pneumonia ([place])   Acute metabolic encephalopathy   AKI (acute kidney injury) ([place])   Essential hypertension   Dementia with behavioral problem   Pleural effusion   Collapse of left lung   Palliative care encounter   Goals of care, counseling/discussion   Encounter for hospice care discussion   Hypoxia   Discharge Condition: Stable  Diet recommendation: Regular/comfort feeds.  Filed Weights   09/06/16 1852  Weight: 60.6 kg (133 lb 9.6 oz)    History of present illness:  Per Dr Christie Nottingham is a 84 y.o. male with medical history significant of HTN, dementia who presented complaining of SOB on exertion, productive cough , chills for last 3 days. He think he has had SOB for 2 weeks. She was also complaining of back pain.  He denied abdominal pain, nausea, or diarrhea. He vomited once on the day prior to admission. He denies cough while he eats.   ED Course: WBC at 22, k at 3.3, BUN 34, chest x ray: Bilateral multifocal air space process, left side pleural effusion.    Hospital Course:  #1 community-acquired pneumonia/left lower lobe parapneumonic effusion concerning for loculation versus empyema/left lower lobe collapse Patient was admitted with community-acquired pneumonia with pleural effusion. Patient was initially empirically placed on IV antibiotics. Patient was monitored. Patient had no significant improvement. Repeat Chest x-ray done 09/09/2016 with worsening  effusion with concerns for possible loculation. Patient noted to be agitated with confusion during this hospitalization. Patient remained afebrile. Leukocytosis trended down. Patient also noted to have left lung collapse. Patient also noted to have an effusion with concerns for parapneumonic effusion. Patient underwent ultrasound-guided thoracentesis 09/10/2016 which was consistent with parapneumonic effusion/empyema by light's criteria with abundant WBCs consistent with an exudate. Repeat CT chest after thoracentesis was done with persistent multiple loculations. Patient was seen in consultation by PCCM and recommendations noted that due to patient's age and dementia likely not a candidate for VATS procedure. Also patient may be not a candidate for possible chest tube placement. Patient was empirically on IV Rocephin and azithromycin as well as duo nebs and Mucinex. Patient did not improve clinically. Comfort measures were recommended per PCCM. Palliative care consultation was obtained and palliative care met with the family. Patient was admitted DO NOT RESUSCITATE. Focus was shifted to full comfort measures. Patient be discharged to a residential hospice home.   #2 acute respiratory failure with hypoxia Likely secondary to problem #1. Patient underwent a diagnostic and therapeutic ultrasound-guided thoracentesis on 09/10/2016 which was consistent with a parapneumonic effusion/empyema. CT of the chest to be done postthoracentesis with persistent multiple loculations. Patient noted not to be a CT of thoracic surgery candidate due to frail physical state and high risk of aspiration. Patient was seen by  PCCM who had recommended DO NOT RESUSCITATE as well as transition to full comfort. Patient was seen in consultation by palliative care met with patient's family and patient was made full comfort measures. Patient will be discharged to a residential hospice home.   #3 acute  encephalopathy in the setting of  underlying dementia Likely secondary to acute pulmonary infection as noted in problem #1. Blood cultures were done which were with no growth to date. Sputum Gram stain and cultures which were done were negative. Patient subsequently underwent a therapeutic and diagnostic thoracentesis which was consistent with parapneumonic effusion/empyema. Patient was empirically on IV antibiotics during the hospitalization. Patient was placed on anxiolytics as needed as Haldol was noted to make his agitation worse. Patient was also placed on Seroquel at bedtime. B-12 levels done was 362. TSH at 2.408. RPR was negative. Ammonia levels at 20. Patient did not have any significant improvement. Patient was seen in consultation by palliative care and patient was made full comfort measures. Patient will be discharged to a residential hospice home.   #4 hypertension Patient was maintained on Norvasc.  #5 dementia Patient was maintained on home regimen of Aricept and Namenda. He deteriorated and subsequently made comfort care.  #6 hypokalemia Repleted.  #7 acute kidney injury Improved with hydration. Will  Procedures:  Ultrasound-guided thoracentesis per IR 09/10/2016  Chest x-ray 09/06/2016, 09/09/2016  CT chest 09/10/2016  Consultations:  Palliative care Dr Rowe Pavy 09/11/2016  PCCM: dr Vaughan Browner 09/10/2016  Discharge Exam: Vitals:   09/10/16 2032 09/11/16 0423  BP: 136/75 (!) 145/87  Pulse: 87 87  Resp: 16 16  Temp: 98.3 F (36.8 C) 98.4 F (36.9 C)    General: NAD Cardiovascular: RRR Respiratory: CTAB  Discharge Instructions   Discharge Instructions    Diet general    Complete by:  As directed    Increase activity slowly    Complete by:  As directed      Current Discharge Medication List    START taking these medications   Details  clonazePAM (KLONOPIN) 0.5 MG disintegrating tablet Take 1 tablet (0.5 mg total) by mouth 3 (three) times daily as needed for seizure. Qty: 20 tablet,  Refills: 0    glycopyrrolate (ROBINUL) 1 MG tablet Take 1 tablet (1 mg total) by mouth every 4 (four) hours as needed (excessive secretions). Qty: 20 tablet, Refills: 0    guaiFENesin (ROBITUSSIN) 100 MG/5ML SOLN Take 5 mLs (100 mg total) by mouth every 6 (six) hours as needed for cough or to loosen phlegm. Qty: 473 mL, Refills: 0    morphine 20 MG/5ML solution Take 1.3 mLs (5.2 mg total) by mouth every 2 (two) hours as needed for pain. Qty: 30 mL, Refills: 0    QUEtiapine (SEROQUEL) 25 MG tablet Take 3 tablets (75 mg total) by mouth at bedtime. Qty: 30 tablet, Refills: 0    senna-docusate (SENOKOT-S) 8.6-50 MG tablet Take 1 tablet by mouth 2 (two) times daily.      CONTINUE these medications which have NOT CHANGED   Details  amLODipine (NORVASC) 5 MG tablet Take 5 mg by mouth daily.    donepezil (ARICEPT) 5 MG tablet Take 5 mg by mouth daily.    MELATONIN PO Take 1 tablet by mouth at bedtime as needed (sleep).    memantine (NAMENDA XR) 28 MG CP24 24 hr capsule Take 28 mg by mouth daily.       Allergies  Allergen Reactions  . Penicillins     "a long time ago."    Follow-up Information    [facility] Follow up.   Why:  F/U WITH MD AT [facility].           The results of significant diagnostics from this hospitalization (including imaging, microbiology, ancillary  and laboratory) are listed below for reference.    Significant Diagnostic Studies: Dg Chest 2 View  Result Date: 09/06/2016 CLINICAL DATA:  Productive cough a few days with low back pain. EXAM: CHEST  2 VIEW COMPARISON:  None. FINDINGS: Patient rotated to the left. Lungs are adequately inflated with patchy airspace opacification throughout the left lung worse over the lung base. Mild opacification over the right mid to lower lung. Findings likely due to infection with associated left pleural effusion. Two surgical clips over the right hilum. Cardiomediastinal silhouette is within normal. There is  calcified plaque over the aortic arch. There are degenerative changes of the spine. IMPRESSION: Bilateral multifocal airspace process left worse than right likely a pneumonia. Associated left-sided pleural effusion. Aortic atherosclerosis. Electronically Signed   By: Marin Olp M.D.   On: 09/06/2016 16:19   Ct Chest Wo Contrast  Result Date: 09/10/2016 CLINICAL DATA:  Dyspnea, cough, and subjective fever and chills. Left lung collapse. Pleural effusion. Progressive confusion. EXAM: CT CHEST WITHOUT CONTRAST TECHNIQUE: Multidetector CT imaging of the chest was performed following the standard protocol without IV contrast. COMPARISON:  Chest radiograph 09/09/2016 FINDINGS: Cardiovascular: Relatively diffuse thoracic aortic calcified atherosclerosis without aneurysm. LAD coronary artery atherosclerosis. Normal heart size. No pericardial effusion. Mediastinum/Nodes: Mildly increased number of nonenlarged mediastinal lymph nodes. No definite hilar lymph node enlargement within limitations of noncontrast technique. No enlarged axillary lymph nodes. Lungs/Pleura: No pneumothorax. Calcified pleural plaques bilaterally. Very small layering right pleural effusion. Small multiloculated left pleural effusion including small loculated components medially along the mediastinal pleura, anteriorly in the lung base, and in the major fissure. Prior right upper lobectomy. Dependent subsegmental atelectasis in the right lower lobe. A small amount of mucus/debris is present in the left mainstem bronchus. There is partial left lower lobe collapse with additional patchy consolidative and nodular opacities in the superior segment. Mild compressive atelectasis is present in the left upper lobe/lingula. Upper Abdomen: Partially visualized calcified stones in the gallbladder. Musculoskeletal: Diffuse thoracic spondylosis. No suspicious osseous lesion. IMPRESSION: 1. Small loculated left pleural effusion. No pneumothorax following  thoracentesis. 2. Very small layering right pleural effusion. 3. Partial left lower lobe collapse. Patchy opacities in the aerated portion of the left lower lobe may reflect pneumonia. 4. Calcified pleural plaques suggesting prior asbestos exposure. 5. Aortic atherosclerosis. 6. Cholelithiasis. Electronically Signed   By: Logan Bores M.D.   On: 09/10/2016 16:21   Dg Chest Port 1 View  Result Date: 09/09/2016 CLINICAL DATA:  Hypoxia EXAM: PORTABLE CHEST 1 VIEW COMPARISON:  09/06/2016 FINDINGS: Interval enlargement of the left effusion now extending superiorly to the left chest apex. Loculation not excluded. Associated left mid and lower lung collapse/ consolidation. Stable cardiomegaly. No definite superimposed edema. Right lung remains clear. Postop changes in the right hilum. Thoracic aortic atherosclerosis noted. IMPRESSION: Enlarging left pleural effusion compared 09/06/2016. This may be loculated. Increased left mid and lower lung collapse/ consolidation. Cardiomegaly without edema. Thoracic aortic atherosclerosis Electronically Signed   By: Jerilynn Mages.  Shick M.D.   On: 09/09/2016 14:54   US Thoracentesis Asp Pleural Space W/img Guide  Result Date: 09/10/2016 INDICATION: Pneumonia with left parapneumonic effusion ,dementia/encephalopathy, ; request made for diagnostic and therapeutic left thoracentesis. EXAM: ULTRASOUND GUIDED DIAGNOSTIC AND THERAPEUTIC LEFT THORACENTESIS MEDICATIONS: None. COMPLICATIONS: None immediate. PROCEDURE: An ultrasound guided thoracentesis was thoroughly discussed with the patient's POA and questions answered. The benefits, risks, alternatives and complications were also discussed. The patient's POA understands and wishes to proceed with the procedure. Written consent  was obtained. Ultrasound was performed to localize and mark an adequate pocket of fluid in the left chest. The area was then prepped and draped in the normal sterile fashion. 1% Lidocaine was used for local anesthesia.  Under ultrasound guidance a Safe-T-Centesis catheter was introduced. Thoracentesis was performed. The catheter was removed and a dressing applied. FINDINGS: A total of approximately 490 cc of turbid, amber fluid was removed. Samples were sent to the laboratory as requested by the clinical team. Due to the multiloculated nature of the left pleural collection only the above amount of fluid could be removed at this time. IMPRESSION: Successful ultrasound guided diagnostic and therapeutic left thoracentesis yielding 490 cc of pleural fluid. Follow-up CT chest pending. Read by: Rowe Robert, PA-C Electronically Signed   By: Jacqulynn Cadet M.D.   On: 09/10/2016 15:08    Microbiology: Recent Results (from the past 240 hour(s))  Culture, blood (routine x 2) Call MD if unable to obtain prior to antibiotics being given     Status: None (Preliminary result)   Collection Time: 09/06/16  7:25 PM  Result Value Ref Range Status   Specimen Description BLOOD LEFT ANTECUBITAL  Final   Special Requests BOTTLES DRAWN AEROBIC ONLY 5ML  Final   Culture   Final    NO GROWTH 4 DAYS Performed at [facility], [address], [place], [place] [address]    Report Status PENDING  Incomplete  Culture, blood (routine x 2) Call MD if unable to obtain prior to antibiotics being given     Status: None (Preliminary result)   Collection Time: 09/06/16  7:25 PM  Result Value Ref Range Status   Specimen Description BLOOD LEFT HAND  Final   Special Requests IN PEDIATRIC BOTTLE 4ML  Final   Culture   Final    NO GROWTH 4 DAYS Performed at [facility], [address] [place], [place] [address]    Report Status PENDING  Incomplete  Culture, body fluid-bottle     Status: None (Preliminary result)   Collection Time: 09/10/16  3:00 PM  Result Value Ref Range Status   Specimen Description FLUID LEFT PLEURAL  Final   Special Requests BOTTLES DRAWN AEROBIC AND ANAEROBIC 10CC  Final   Culture   Final    NO GROWTH <  24 HOURS Performed at [facility], [place] [address], [place], [place] [address]    Report Status PENDING  Incomplete  Gram stain     Status: None   Collection Time: 09/10/16  3:00 PM  Result Value Ref Range Status   Specimen Description FLUID LEFT PLEURAL  Final   Special Requests NONE  Final   Gram Stain   Final    ABUNDANT WBC PRESENT,BOTH PMN AND MONONUCLEAR NO ORGANISMS SEEN Performed at [facility], [address] [place], [place] [address]    Report Status 09/10/2016 FINAL  Final     Labs: Basic Metabolic Panel:  Recent Labs Lab 09/07/16 0411 09/08/16 0843 09/09/16 0450 09/10/16 0405 09/11/16 0405  NA 142 140 143 144 142  K 3.7 3.4* 3.6 3.4* 3.6  CL 109 109 109 111 109  CO2 25 25 26 26 27   GLUCOSE 125* 110* 121* 85 87  BUN 29* 28* 25* 23* 27*  CREATININE 0.86 0.76 0.83 0.85 0.74  CALCIUM 8.7* 8.4* 8.4* 8.2* 8.4*  MG  --  2.0  --   --  2.2   Liver Function Tests:  Recent Labs  Lab 09/11/16 0405  AST 44*  ALT 56  ALKPHOS 191*  BILITOT 0.7  PROT 5.6*  ALBUMIN 2.5*   No results for input(s): LIPASE, AMYLASE in the last 168 hours.  Recent Labs Lab 09/09/16 1441  AMMONIA 20   CBC:  Recent Labs Lab 09/06/16 1554  09/07/16 0411 09/08/16 0843 09/09/16 0450 09/10/16 0405 09/11/16 0405  WBC 22.5*  --  19.2* 13.1* 9.9 7.7 9.2  NEUTROABS 19.7*  --   --   --   --   --  7.3  HGB 12.3*  < > 12.0* 10.9* 11.3* 10.5* 11.8*  HCT 36.7*  < > 36.6* 32.3* 33.9* 31.3* 35.7*  MCV 96.8  --  96.8 97.0 93.9 96.3 96.7  PLT 189  --  189 188 223 244 269  < > = values in this interval not displayed. Cardiac Enzymes:  Recent Labs Lab 09/09/16 1441  CKTOTAL 179   BNP: BNP (last 3 results) No results for input(s): BNP in the last 8760 hours.  ProBNP (last 3 results) No results for input(s): PROBNP in the last 8760 hours.  CBG: No results for input(s): GLUCAP in the last 168 hours.     SignedIrine Seal MD.  [facility] 09/11/2016, 3:21 PM

## 2016-09-11 NOTE — Progress Notes (Addendum)
1:37 PM Patient and family are agreeable to transfer to [facility] today for comfort care/hospice.  Family is at bedside to complete paperwork for hospice. MD has been paged for DC summary.    LCSW will complete EMS transport and assist with transition.   LCSW continues to follow for disposition:  Recommendation currently: hospice home.  LCSW discussed case with attending and palliative care team. Family in room wife and POA.  Would like to explore local residential hospice facilities at this time: [facility] and [facility]. Referrals have been sent to both hospice homes for review.  POA currently does not have a first choice, wants to explore both options.  LCSW has made both hospice homes aware.  Wife is working to reach patient's daughters who live out of state and notify of patient's plans.  LCSW will continue to follow and assist with disposition.  Plan: Comfort care: Hospice Home.  Lane Hacker, MSW Clinical Social Work: [employer] for :  [phone]

## 2016-09-12 LAB — CULTURE, BLOOD (ROUTINE X 2)
CULTURE: NO GROWTH
CULTURE: NO GROWTH

## 2016-09-15 LAB — CULTURE, BODY FLUID W GRAM STAIN -BOTTLE: Culture: NO GROWTH

## 2016-09-21 DEATH — deceased

## 2016-11-11 ENCOUNTER — Ambulatory Visit: Payer: Medicare HMO | Admitting: Neurology

## 2017-09-08 IMAGING — DX DG CHEST 1V PORT
1 series · 1 of 1 positions shown · non-contrast
Comparison: 09/06/2016

CLINICAL DATA: Hypoxia

EXAM:
PORTABLE CHEST 1 VIEW

[chest ap]
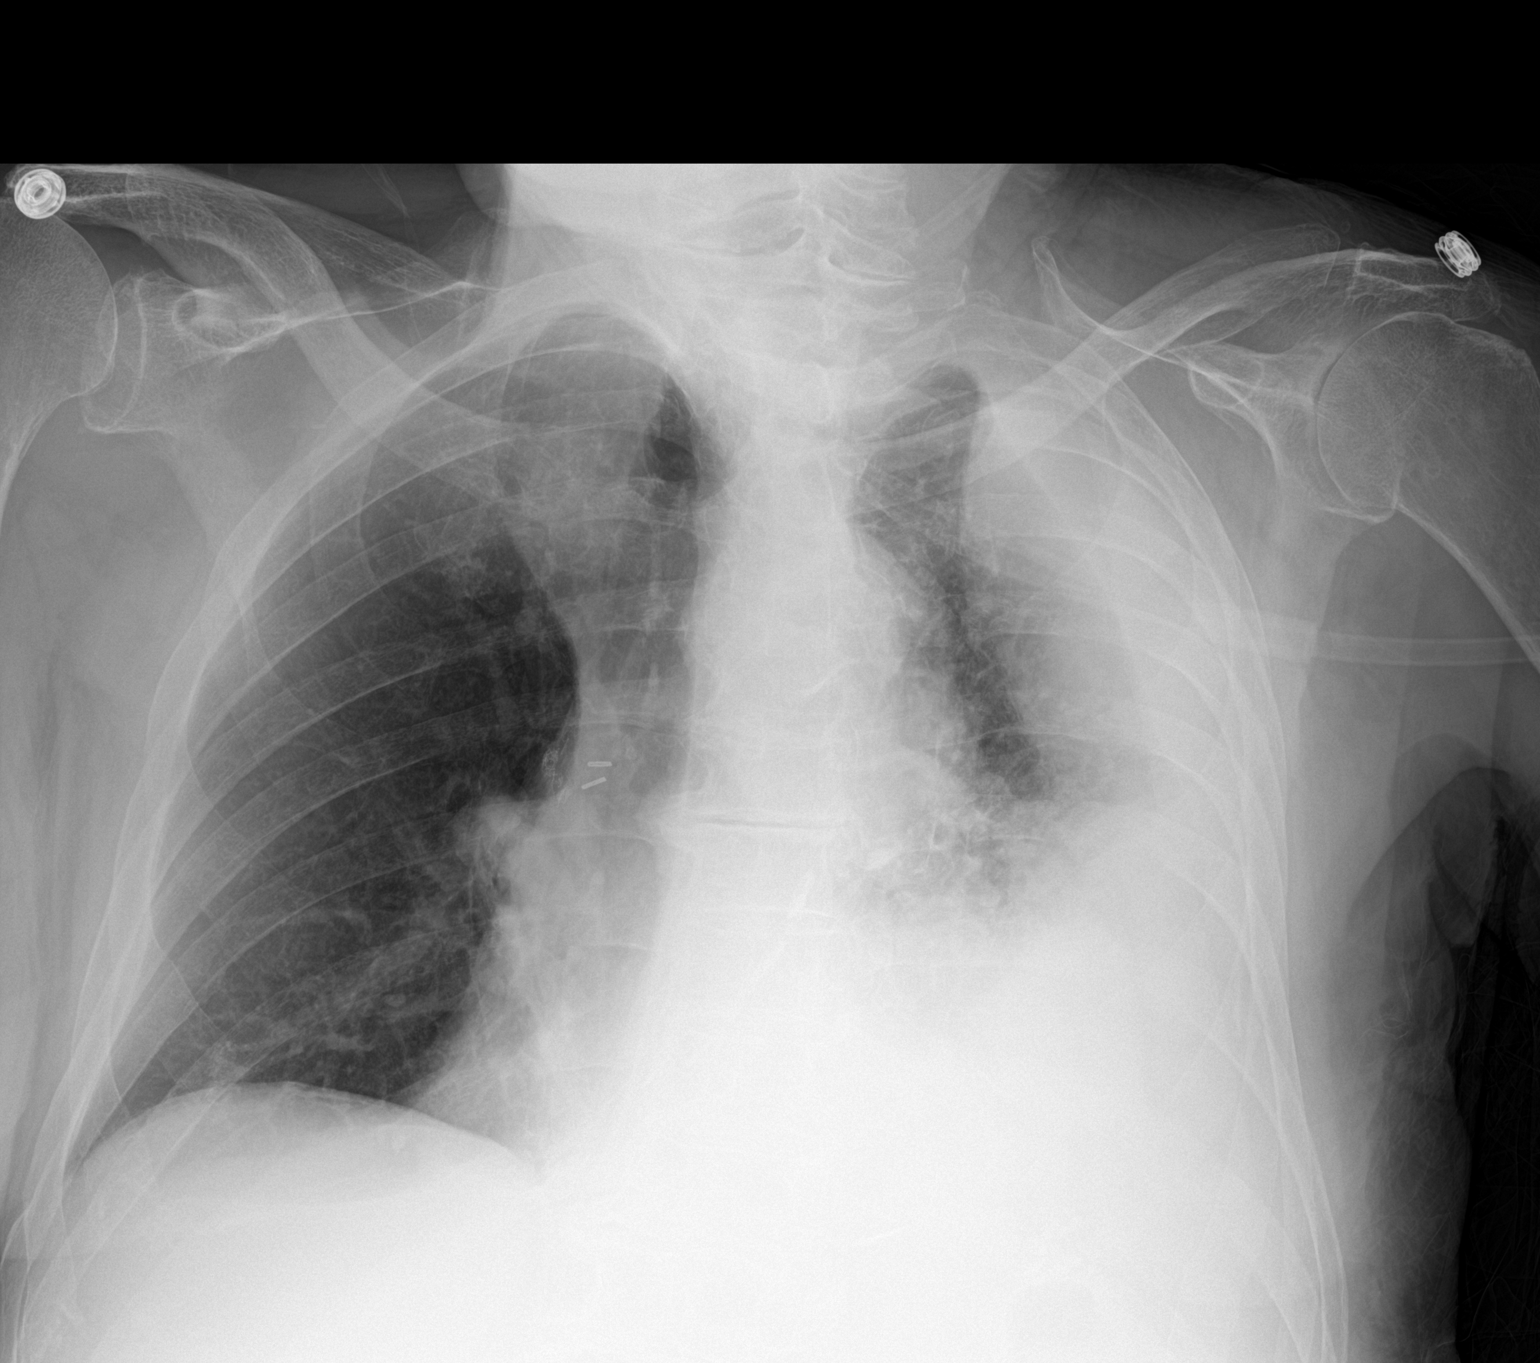

[1 of 1 positions shown; findings below may reference images not displayed]

FINDINGS: Interval enlargement of the left effusion now extending superiorly
to the left chest apex. Loculation not excluded. Associated left mid
and lower lung collapse/ consolidation. Stable cardiomegaly. No
definite superimposed edema. Right lung remains clear. Postop
changes in the right hilum. Thoracic aortic atherosclerosis noted.
IMPRESSION: Enlarging left pleural effusion compared 09/06/2016. This may be
loculated.

Increased left mid and lower lung collapse/ consolidation.

Cardiomegaly without edema.

Thoracic aortic atherosclerosis

## 2017-09-09 IMAGING — US US THORACENTESIS ASP PLEURAL SPACE W/IMG GUIDE
1 series · 6 of 6 positions shown · non-contrast
Comparison: none

INDICATION: Pneumonia with left parapneumonic effusion ,dementia/encephalopathy,
; request made for diagnostic and therapeutic left thoracentesis.

[Series 1: us thoracentesis asp pleural space w/img guide · 0.26mm/px · 6 of 6 slices shown]
[im 1/6]
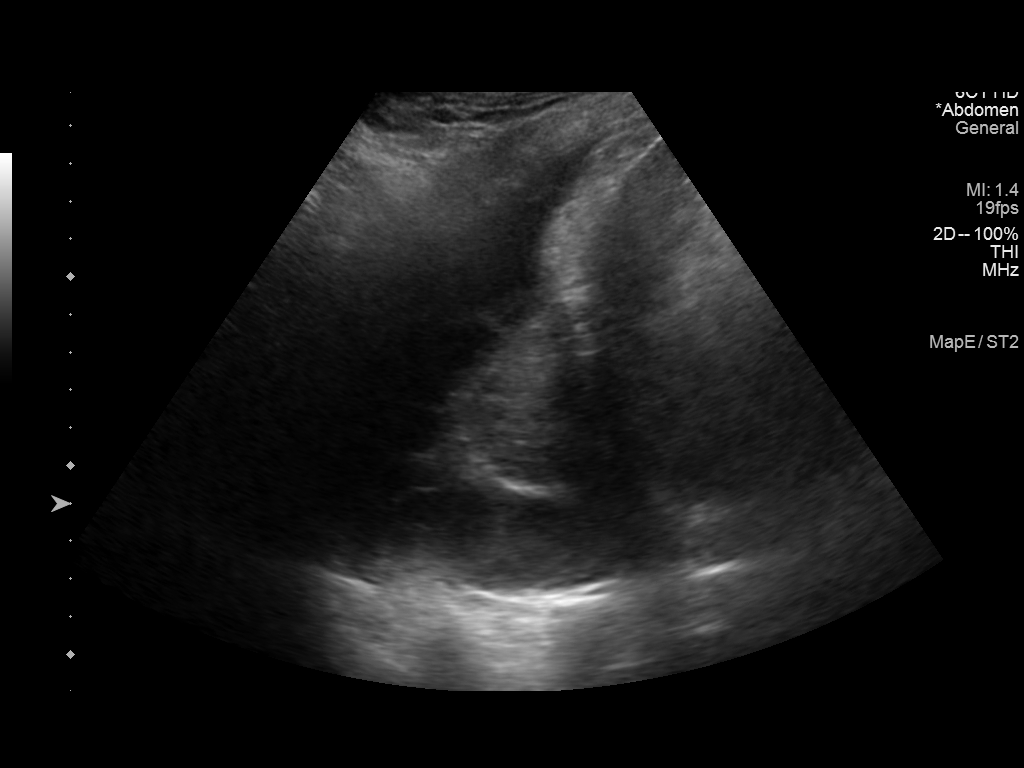
[im 2/6]
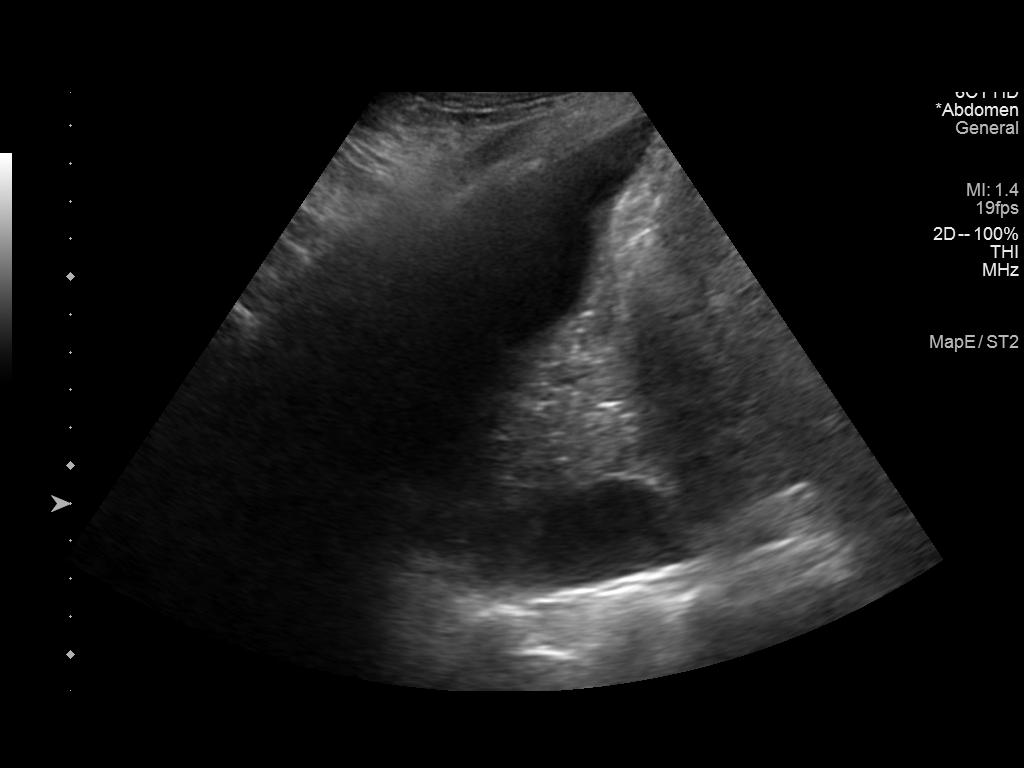
[im 3/6]
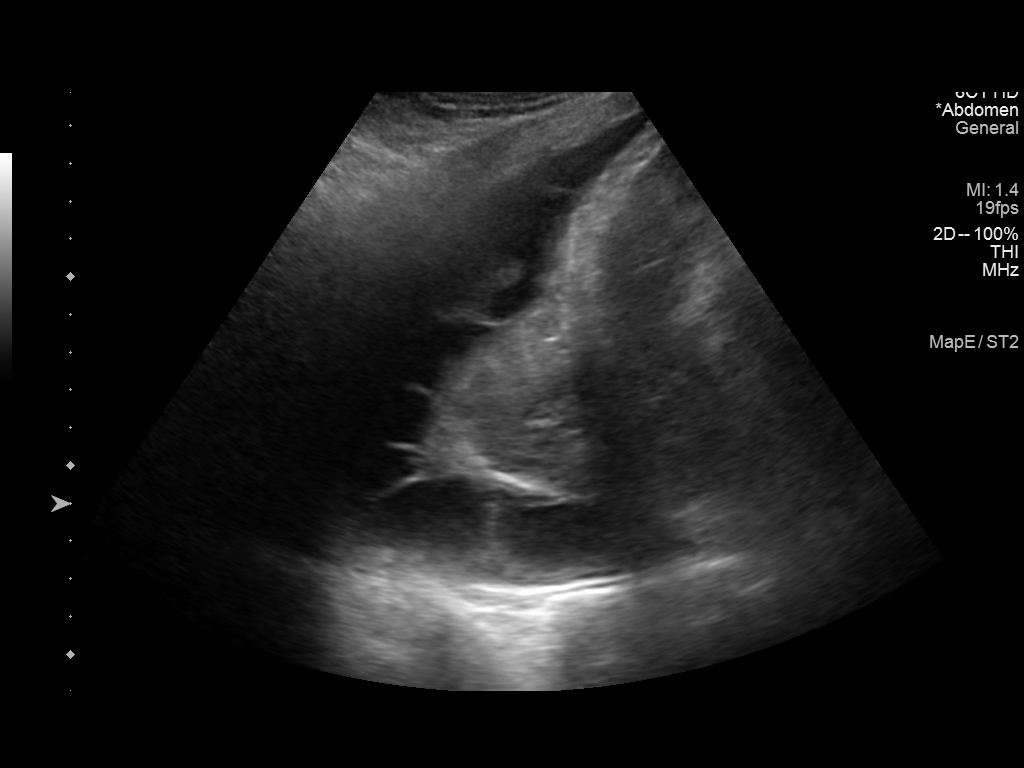
[im 4/6]
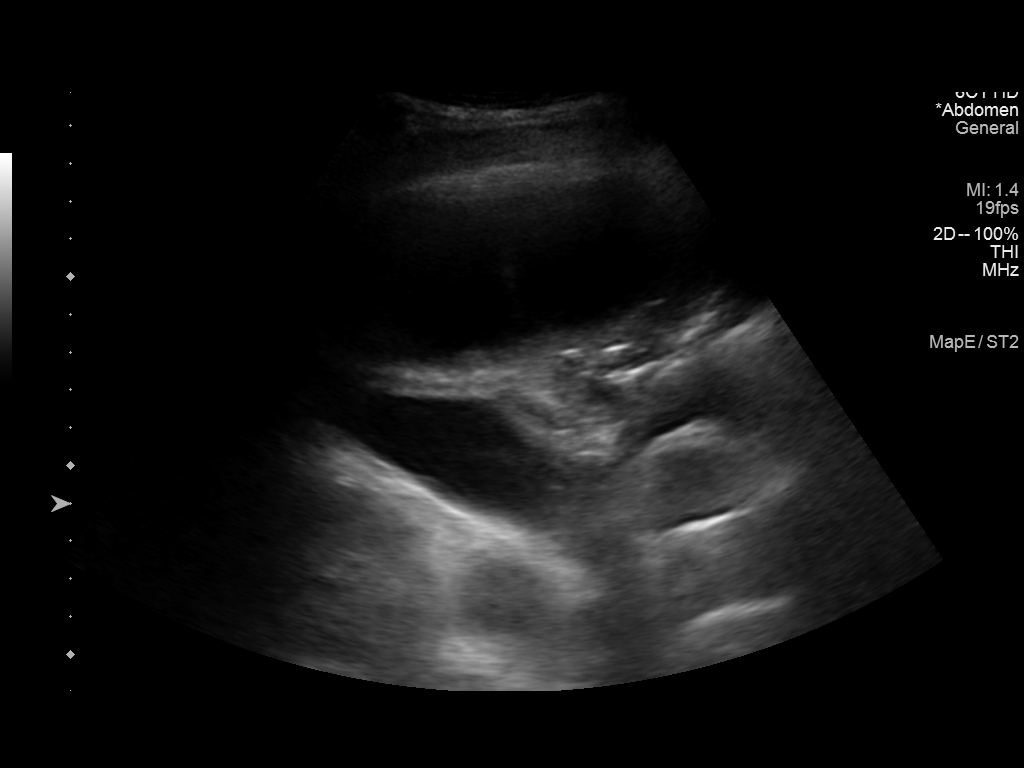
[im 5/6]
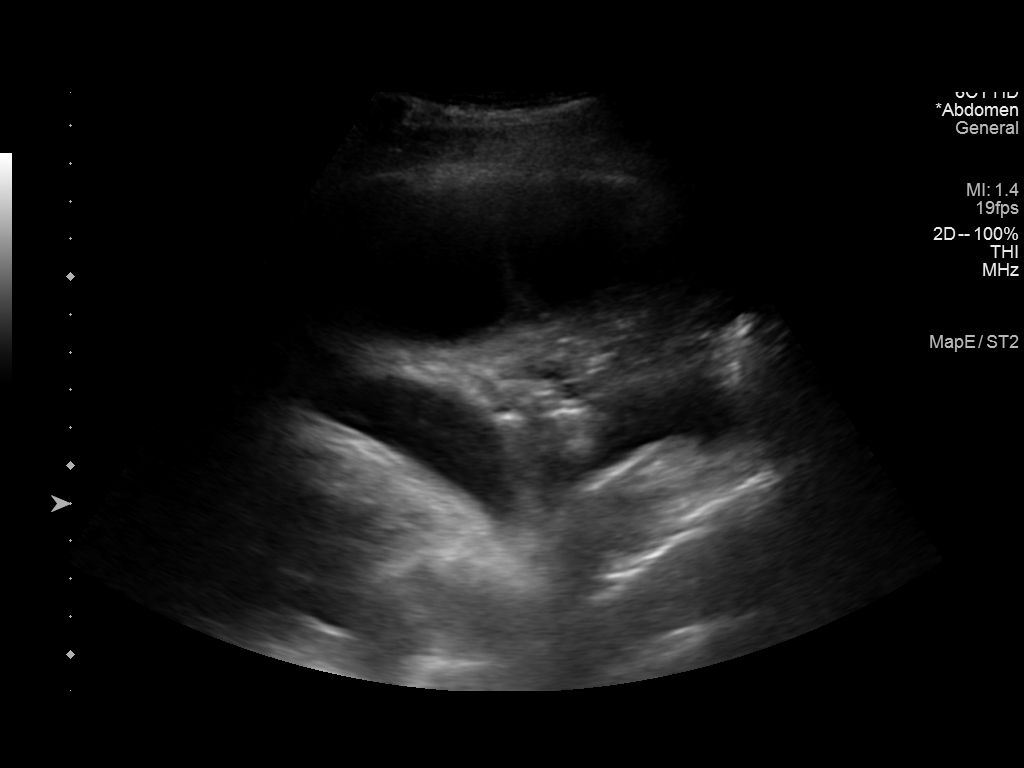
[im 6/6]
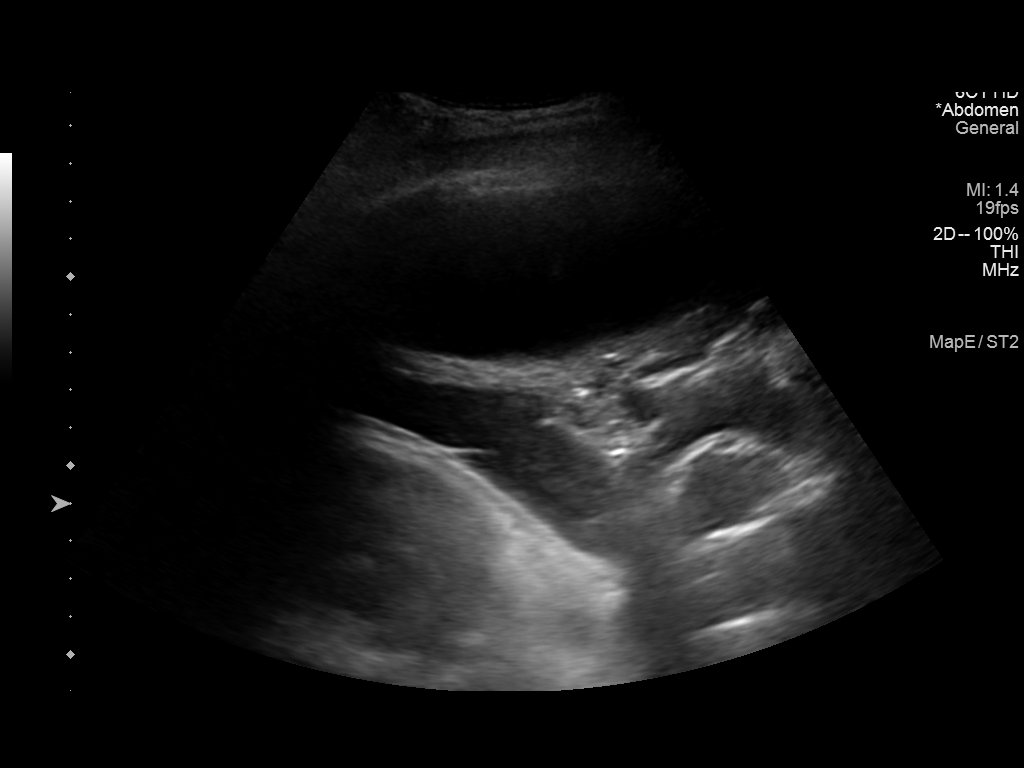

[6 of 6 positions shown; findings below may reference images not displayed]

EXAM:
ULTRASOUND GUIDED DIAGNOSTIC AND THERAPEUTIC LEFT THORACENTESIS

MEDICATIONS:
None.

COMPLICATIONS:
None immediate.

PROCEDURE:
An ultrasound guided thoracentesis was thoroughly discussed with the
patient's POA and questions answered. The benefits, risks,
alternatives and complications were also discussed. The patient's
POA understands and wishes to proceed with the procedure. Written
consent was obtained.

Ultrasound was performed to localize and mark an adequate pocket of
fluid in the left chest. The area was then prepped and draped in the
normal sterile fashion. 1% Lidocaine was used for local anesthesia.
Under ultrasound guidance a Safe-T-Centesis catheter was introduced.
Thoracentesis was performed. The catheter was removed and a dressing
applied.
FINDINGS: A total of approximately 490 cc of turbid, amber fluid was removed.
Samples were sent to the laboratory as requested by the clinical
team. Due to the multiloculated nature of the left pleural
collection only the above amount of fluid could be removed at this
time.
IMPRESSION: Successful ultrasound guided diagnostic and therapeutic left
thoracentesis yielding 490 cc of pleural fluid. Follow-up CT chest
pending.

## 2017-09-09 IMAGING — CT CT CHEST W/O CM
2 of 4 series · 15 of 36 positions shown, 18 images · non-contrast
Comparison: Chest radiograph 09/09/2016

CLINICAL DATA: Dyspnea, cough, and subjective fever and chills.
Left lung collapse. Pleural effusion. Progressive confusion.

EXAM:
CT CHEST WITHOUT CONTRAST
TECHNIQUE: Multidetector CT imaging of the chest was performed following the
standard protocol without IV contrast.

[Series 2: thorax · axial · 0.76mm/px · z∈[-34,+186]mm · 12 of 132 slices shown, 15 images]
[im 11/132  mediastinal]
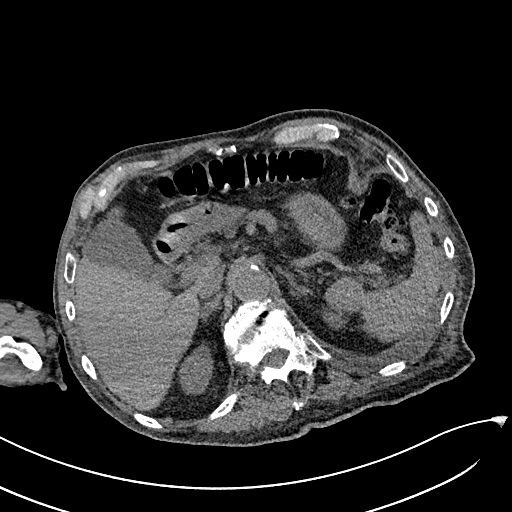
[im 11/132  lung]
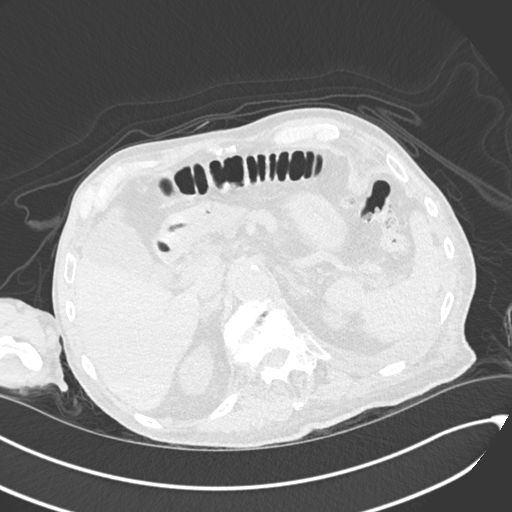
[im 21/132  lung]
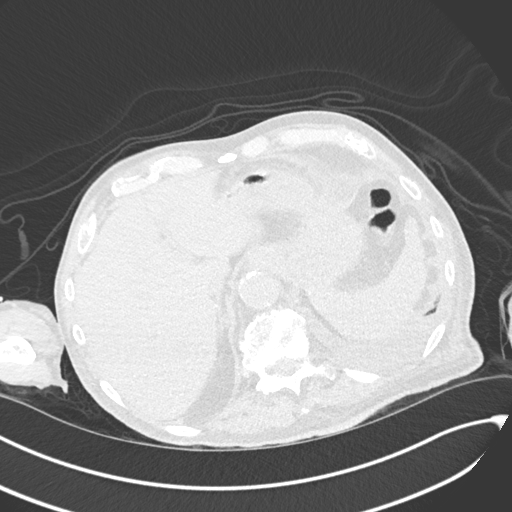
[im 31/132  lung]
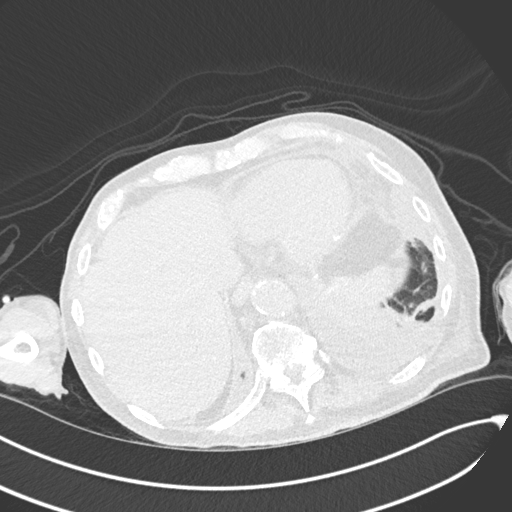
[im 41/132  lung]
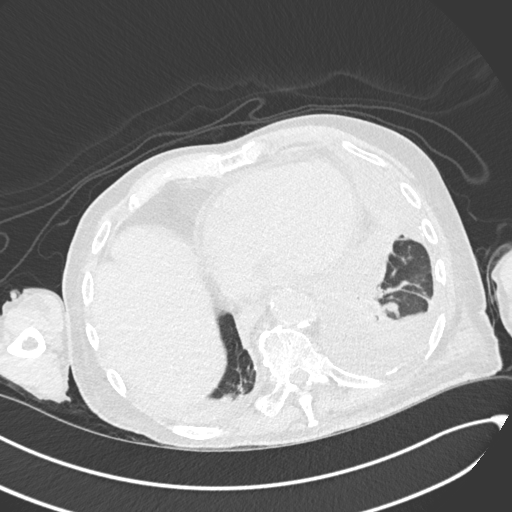
[im 51/132  mediastinal]
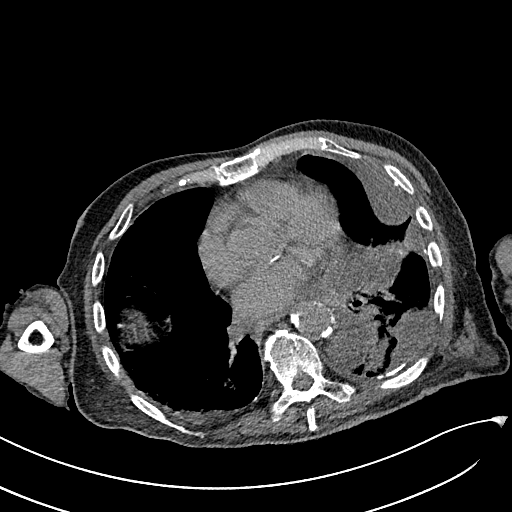
[im 51/132  lung]
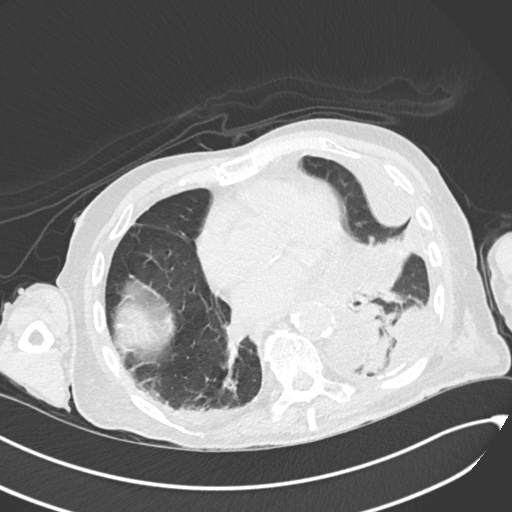
[im 61/132  lung]
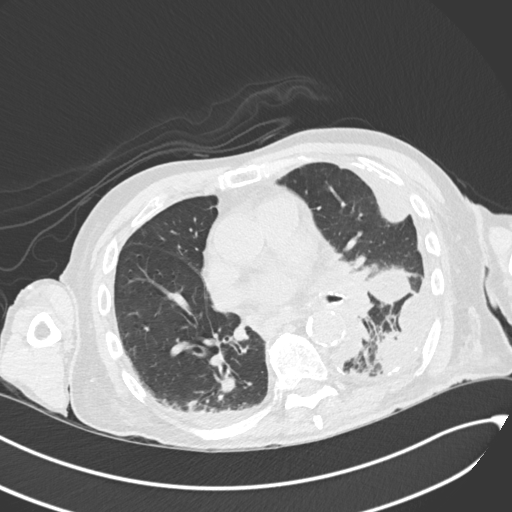
[im 71/132  lung]
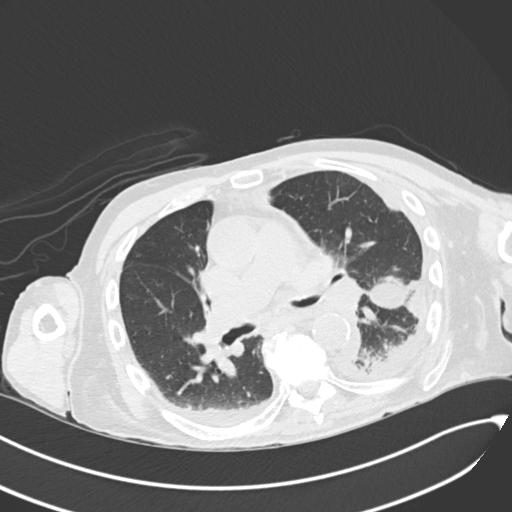
[im 81/132  lung]
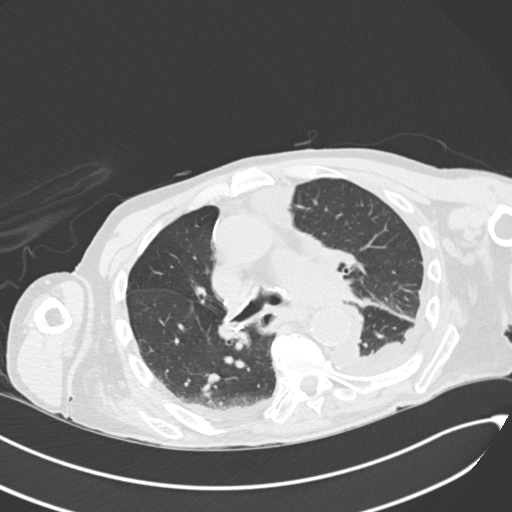
[im 91/132  mediastinal]
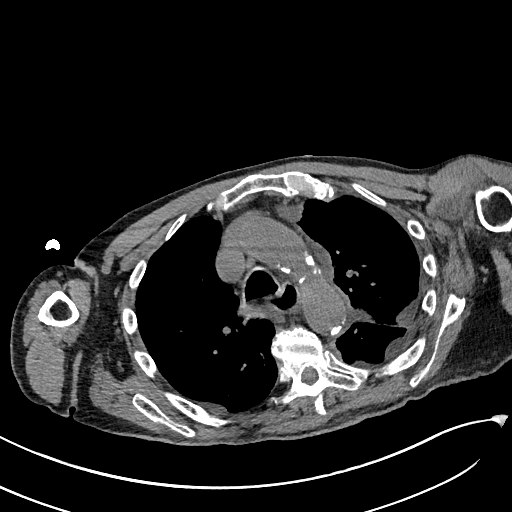
[im 91/132  lung]
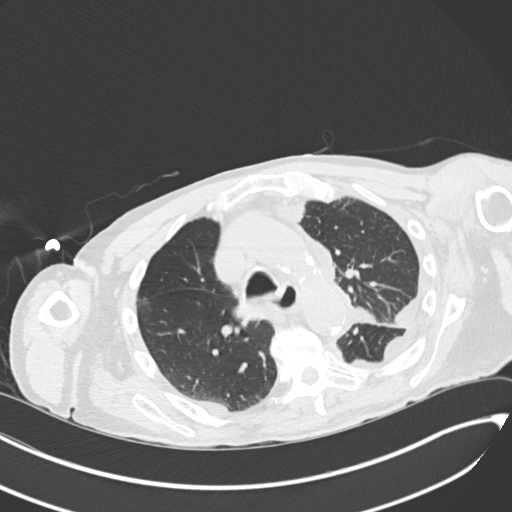
[im 101/132  lung]
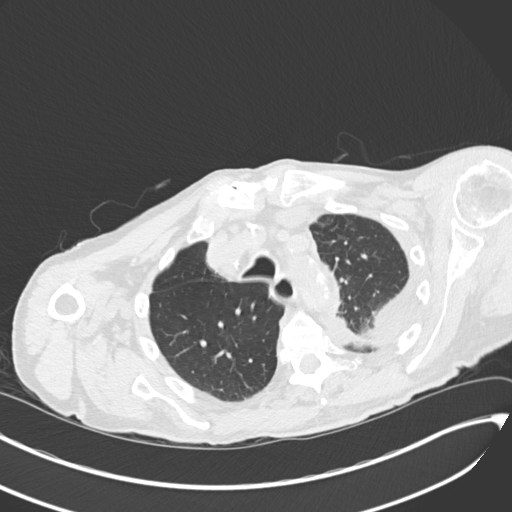
[im 111/132  lung]
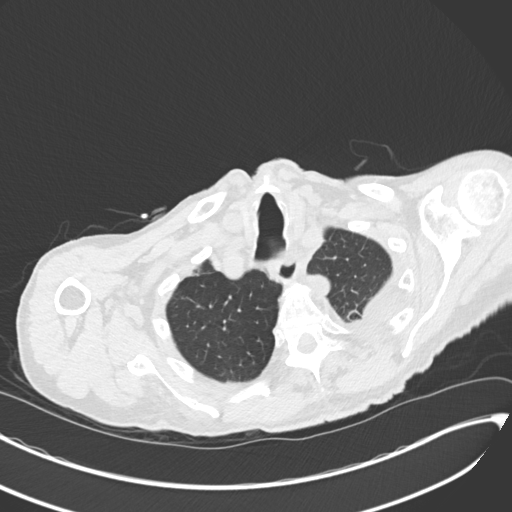
[im 121/132  lung]
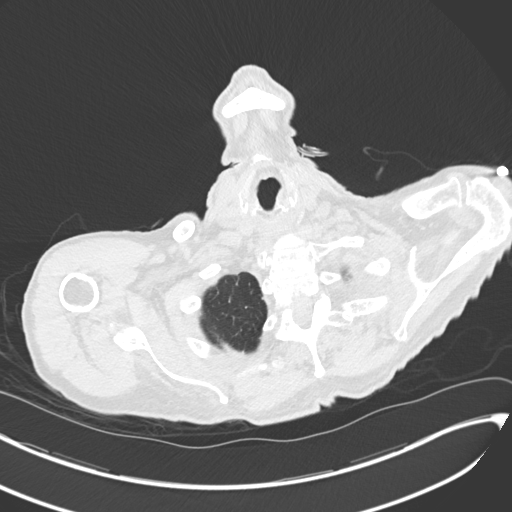

[Series 5: coronal · coronal · 0.62mm/px · 3 of 123 slices shown]
[im 25/123  lung]
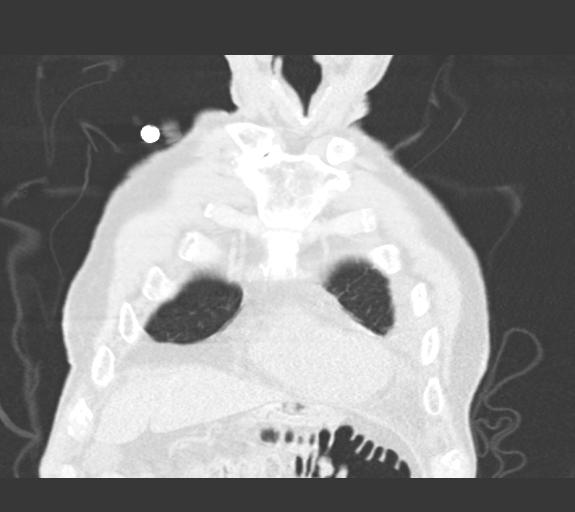
[im 49/123  lung]
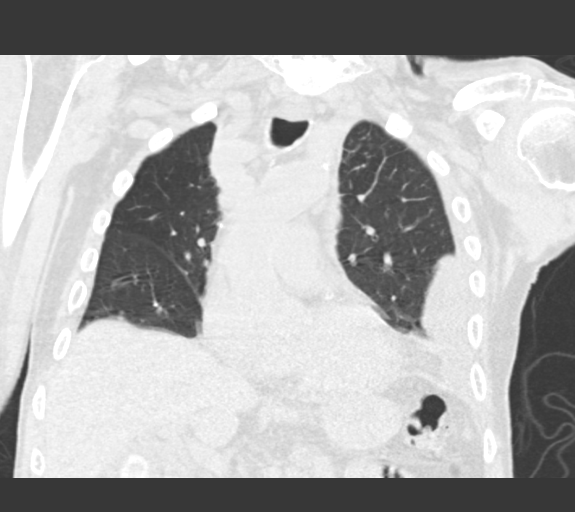
[im 74/123  lung]
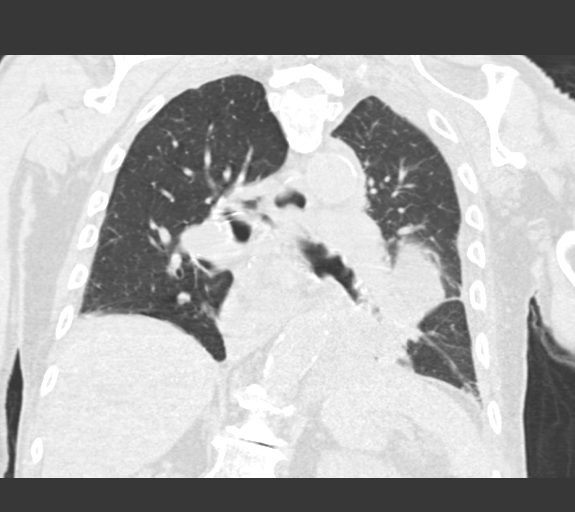

[15 of 36 positions shown; findings below may reference images not displayed]

FINDINGS: Cardiovascular: Relatively diffuse thoracic aortic calcified
atherosclerosis without aneurysm. LAD coronary artery
atherosclerosis. Normal heart size. No pericardial effusion.

Mediastinum/Nodes: Mildly increased number of nonenlarged
mediastinal lymph nodes. No definite hilar lymph node enlargement
within limitations of noncontrast technique. No enlarged axillary
lymph nodes.

Lungs/Pleura: No pneumothorax. Calcified pleural plaques
bilaterally. Very small layering right pleural effusion. Small
multiloculated left pleural effusion including small loculated
components medially along the mediastinal pleura, anteriorly in the
lung base, and in the major fissure. Prior right upper lobectomy.
Dependent subsegmental atelectasis in the right lower lobe. A small
amount of mucus/debris is present in the left mainstem bronchus.
There is partial left lower lobe collapse with additional patchy
consolidative and nodular opacities in the superior segment. Mild
compressive atelectasis is present in the left upper lobe/lingula.

Upper Abdomen: Partially visualized calcified stones in the
gallbladder.

Musculoskeletal: Diffuse thoracic spondylosis. No suspicious osseous
lesion.
IMPRESSION: 1. Small loculated left pleural effusion. No pneumothorax following
thoracentesis.
2. Very small layering right pleural effusion.
3. Partial left lower lobe collapse. Patchy opacities in the aerated
portion of the left lower lobe may reflect pneumonia.
4. Calcified pleural plaques suggesting prior asbestos exposure.
5. Aortic atherosclerosis.
6. Cholelithiasis.
